# Patient Record
Sex: Male | Born: 1975 | Race: White | Hispanic: No | Marital: Married | Smoking: Current every day smoker
Health system: Southern US, Community
[De-identification: ages and names within clinical notes are randomized; demographics above are authoritative.]

## PROBLEM LIST (undated history)

## (undated) DIAGNOSIS — K219 Gastro-esophageal reflux disease without esophagitis: Secondary | ICD-10-CM

## (undated) DIAGNOSIS — Z8719 Personal history of other diseases of the digestive system: Secondary | ICD-10-CM

## (undated) DIAGNOSIS — F419 Anxiety disorder, unspecified: Secondary | ICD-10-CM

## (undated) DIAGNOSIS — E785 Hyperlipidemia, unspecified: Secondary | ICD-10-CM

## (undated) DIAGNOSIS — Z87442 Personal history of urinary calculi: Secondary | ICD-10-CM

## (undated) DIAGNOSIS — J189 Pneumonia, unspecified organism: Secondary | ICD-10-CM

## (undated) HISTORY — PX: SKIN CANCER EXCISION: SHX779

## (undated) HISTORY — PX: POLYPECTOMY: SHX149

---

## 2003-06-07 ENCOUNTER — Emergency Department (HOSPITAL_COMMUNITY): Admission: EM | Admit: 2003-06-07 | Discharge: 2003-06-07 | Payer: Self-pay | Admitting: Emergency Medicine

## 2004-03-19 ENCOUNTER — Emergency Department (HOSPITAL_COMMUNITY): Admission: EM | Admit: 2004-03-19 | Discharge: 2004-03-20 | Payer: Self-pay | Admitting: Emergency Medicine

## 2004-03-23 ENCOUNTER — Emergency Department (HOSPITAL_COMMUNITY): Admission: EM | Admit: 2004-03-23 | Discharge: 2004-03-23 | Payer: Self-pay | Admitting: *Deleted

## 2008-10-10 ENCOUNTER — Ambulatory Visit (HOSPITAL_COMMUNITY): Admission: RE | Admit: 2008-10-10 | Discharge: 2008-10-10 | Payer: Self-pay | Admitting: Family Medicine

## 2008-11-25 ENCOUNTER — Ambulatory Visit (HOSPITAL_COMMUNITY): Admission: RE | Admit: 2008-11-25 | Discharge: 2008-11-25 | Payer: Self-pay | Admitting: Family Medicine

## 2010-03-20 ENCOUNTER — Ambulatory Visit (HOSPITAL_COMMUNITY): Admission: RE | Admit: 2010-03-20 | Discharge: 2010-03-20 | Payer: Self-pay | Admitting: Family Medicine

## 2011-08-09 ENCOUNTER — Emergency Department (HOSPITAL_COMMUNITY)
Admission: EM | Admit: 2011-08-09 | Discharge: 2011-08-09 | Disposition: A | Discharge status: Home / Self Care | Payer: 59 | Attending: Emergency Medicine | Admitting: Emergency Medicine

## 2011-08-09 ENCOUNTER — Emergency Department (HOSPITAL_COMMUNITY): Payer: 59

## 2011-08-09 DIAGNOSIS — R1110 Vomiting, unspecified: Secondary | ICD-10-CM | POA: Insufficient documentation

## 2011-08-09 DIAGNOSIS — R059 Cough, unspecified: Secondary | ICD-10-CM | POA: Insufficient documentation

## 2011-08-09 DIAGNOSIS — R197 Diarrhea, unspecified: Secondary | ICD-10-CM | POA: Insufficient documentation

## 2011-08-09 DIAGNOSIS — R05 Cough: Secondary | ICD-10-CM | POA: Insufficient documentation

## 2011-08-09 DIAGNOSIS — Z79899 Other long term (current) drug therapy: Secondary | ICD-10-CM | POA: Insufficient documentation

## 2011-08-09 DIAGNOSIS — B9789 Other viral agents as the cause of diseases classified elsewhere: Secondary | ICD-10-CM

## 2011-08-09 DIAGNOSIS — F17200 Nicotine dependence, unspecified, uncomplicated: Secondary | ICD-10-CM | POA: Insufficient documentation

## 2011-08-09 LAB — BASIC METABOLIC PANEL
Chloride: 101 mEq/L (ref 96–112)
GFR calc Af Amer: >90 mL/min (ref >90)
GFR calc non Af Amer: >90 mL/min (ref >90)
Potassium: 3.9 mEq/L (ref 3.5–5.1)
Sodium: 138 mEq/L (ref 135–145)

## 2011-08-09 LAB — CBC
MCH: 31.4 pg (ref 26.0–34.0)
MCHC: 34.4 g/dL (ref 30.0–36.0)
Platelets: 173 10*3/uL (ref 150–400)

## 2011-08-09 MED ORDER — ONDANSETRON HCL 4 MG PO TABS: 8.0000 mg | ORAL_TABLET | Freq: Two times a day (BID) | ORAL | Status: AC | PRN

## 2011-08-09 MED ORDER — ONDANSETRON HCL 4 MG/2ML IJ SOLN
4.0000 mg | Freq: Once | INTRAMUSCULAR | Status: AC
  Administered 2011-08-09: 4 mg via INTRAVENOUS
  Filled 2011-08-09: qty 2

## 2011-08-09 MED ORDER — SODIUM CHLORIDE 0.9 % IV SOLN: 20.0000 mL | INTRAVENOUS | Status: DC

## 2011-08-09 MED ORDER — SODIUM CHLORIDE 0.9 % IV SOLN
20.0000 mL | INTRAVENOUS | Status: DC
  Administered 2011-08-09: 100 mL via INTRAVENOUS

## 2011-08-09 NOTE — ED Notes (Signed)
Vomiting, fever, cough, now diarrhea

## 2011-08-09 NOTE — ED Provider Notes (Signed)
History     CSN: [id number] Arrival date & time: 08/09/2011  5:35 AM  Chief Complaint  Patient presents with  . Emesis  . Fever    (Consider location/radiation/quality/duration/timing/severity/associated sxs/prior treatment) HPI Complains of cough with greenish sputum fever vomiting and diarrhea onset 4 PM yesterday maximum temperature 101 treated himself with NyQuil and Advil with partial relief no shortness of breath no other complaint no nausea at present treated with Zofran and IV fluids prior to my exam no abdominal pain no headache History reviewed. No pertinent past medical history. Past history hypercholesterolemia History reviewed. No pertinent past surgical history. Surgical history cyst removed from vocal cords History reviewed. No pertinent family history.  History  Substance Use Topics  . Smoking status: Current Everyday Smoker  . Smokeless tobacco: Not on file  . Alcohol Use: No   no alcohol no drugs    Review of Systems  Constitutional: Positive for fever.  HENT: Negative.   Respiratory: Positive for cough.   Cardiovascular: Negative.   Gastrointestinal: Positive for vomiting and diarrhea.  Musculoskeletal: Negative.   Skin: Negative.   Neurological: Negative.   Hematological: Negative.   Psychiatric/Behavioral: Negative.     Allergies  Sulfa antibiotics  Home Medications   Current Outpatient Rx  Name Route Sig Dispense Refill  . ALPRAZOLAM 0.5 MG PO TABS Oral Take 0.5 mg by mouth 2 (two) times daily as needed.      . ATORVASTATIN CALCIUM 10 MG PO TABS Oral Take 10 mg by mouth daily.      Marland Kitchen HYDROCODONE-ACETAMINOPHEN 10-650 MG PO TABS Oral Take 1 tablet by mouth 2 times daily at 12 noon and 4 pm.        BP 137/73  Pulse 111  Temp(Src) 98.9 F (37.2 C) (Oral)  Resp 20  Ht 5\' 7"  (1.702 m)  Wt 180 lb (81.647 kg)  BMI 28.19 kg/m2  SpO2 99%  Physical Exam  Nursing note and vitals reviewed. Constitutional: He appears well-developed and  well-nourished.  HENT:  Head: Normocephalic and atraumatic.  Eyes: Conjunctivae are normal. Pupils are equal, round, and reactive to light.  Neck: Neck supple. No tracheal deviation present. No thyromegaly present.  Cardiovascular: Normal rate and regular rhythm.   No murmur heard. Pulmonary/Chest: Effort normal.       Diffuse scant rhonchi no respiratory distress  Abdominal: Soft. Bowel sounds are normal. He exhibits no distension. There is no tenderness.  Musculoskeletal: Normal range of motion. He exhibits no edema and no tenderness.  Neurological: He is alert. Coordination normal.  Skin: Skin is warm and dry. No rash noted.  Psychiatric: He has a normal mood and affect.    ED Course  Procedures (including critical care time) at 8:20 a.m. patient feels much improved no nausea appears comfortable  Labs Reviewed  BASIC METABOLIC PANEL  CBC   No results found.   No diagnosis found.    MDM  Suspect viral illness no evidence of pneumonia on chest x-ray plan prescription for Zofran encourage  mouth fluids. Stop smoking encouraged return if feels worse for any reason. See Dr. Regino Schultze if not improved in a week Tylenol for fever or aches   Diagnosis #1 viral illness tightness #2 tobacco abuse     Doug Sou, MD 08/09/11 [phone]

## 2016-05-18 ENCOUNTER — Other Ambulatory Visit (HOSPITAL_COMMUNITY): Payer: Self-pay | Admitting: Internal Medicine

## 2016-05-18 ENCOUNTER — Ambulatory Visit (HOSPITAL_COMMUNITY)
Admission: RE | Admit: 2016-05-18 | Discharge: 2016-05-18 | Disposition: A | Discharge status: Home / Self Care | Payer: 59 | Source: Ambulatory Visit | Attending: Internal Medicine | Admitting: Internal Medicine

## 2016-05-18 DIAGNOSIS — M25511 Pain in right shoulder: Secondary | ICD-10-CM

## 2016-11-23 DIAGNOSIS — G894 Chronic pain syndrome: Secondary | ICD-10-CM | POA: Diagnosis not present

## 2016-12-08 DIAGNOSIS — Z0000 Encounter for general adult medical examination without abnormal findings: Secondary | ICD-10-CM | POA: Diagnosis not present

## 2016-12-08 DIAGNOSIS — E782 Mixed hyperlipidemia: Secondary | ICD-10-CM | POA: Diagnosis not present

## 2017-03-02 DIAGNOSIS — G894 Chronic pain syndrome: Secondary | ICD-10-CM | POA: Diagnosis not present

## 2017-03-02 DIAGNOSIS — Z1389 Encounter for screening for other disorder: Secondary | ICD-10-CM | POA: Diagnosis not present

## 2017-04-13 ENCOUNTER — Encounter (HOSPITAL_COMMUNITY): Payer: Self-pay | Admitting: *Deleted

## 2017-04-13 ENCOUNTER — Other Ambulatory Visit: Payer: Self-pay

## 2017-04-13 ENCOUNTER — Emergency Department (HOSPITAL_COMMUNITY): Payer: 59

## 2017-04-13 ENCOUNTER — Emergency Department (HOSPITAL_COMMUNITY)
Admission: EM | Admit: 2017-04-13 | Discharge: 2017-04-13 | Disposition: A | Discharge status: Home / Self Care | Payer: 59 | Attending: Emergency Medicine | Admitting: Emergency Medicine

## 2017-04-13 DIAGNOSIS — H539 Unspecified visual disturbance: Secondary | ICD-10-CM | POA: Insufficient documentation

## 2017-04-13 DIAGNOSIS — F17210 Nicotine dependence, cigarettes, uncomplicated: Secondary | ICD-10-CM | POA: Diagnosis not present

## 2017-04-13 DIAGNOSIS — S199XXA Unspecified injury of neck, initial encounter: Secondary | ICD-10-CM | POA: Diagnosis not present

## 2017-04-13 DIAGNOSIS — Z79899 Other long term (current) drug therapy: Secondary | ICD-10-CM | POA: Diagnosis not present

## 2017-04-13 DIAGNOSIS — R55 Syncope and collapse: Secondary | ICD-10-CM | POA: Insufficient documentation

## 2017-04-13 DIAGNOSIS — R51 Headache: Secondary | ICD-10-CM | POA: Diagnosis not present

## 2017-04-13 DIAGNOSIS — R05 Cough: Secondary | ICD-10-CM | POA: Diagnosis not present

## 2017-04-13 DIAGNOSIS — S0990XA Unspecified injury of head, initial encounter: Secondary | ICD-10-CM | POA: Diagnosis not present

## 2017-04-13 DIAGNOSIS — R42 Dizziness and giddiness: Secondary | ICD-10-CM | POA: Insufficient documentation

## 2017-04-13 DIAGNOSIS — R4182 Altered mental status, unspecified: Secondary | ICD-10-CM | POA: Diagnosis present

## 2017-04-13 HISTORY — DX: Hyperlipidemia, unspecified: E78.5

## 2017-04-13 LAB — CBC
HCT: 45.5 % (ref 39.0–52.0)
Hemoglobin: 15.9 g/dL (ref 13.0–17.0)
MCH: 30.9 pg (ref 26.0–34.0)
MCHC: 34.9 g/dL (ref 30.0–36.0)
MCV: 88.3 fL (ref 78.0–100.0)
PLATELETS: 225 10*3/uL (ref 150–400)
RBC: 5.15 MIL/uL (ref 4.22–5.81)
RDW: 12.8 % (ref 11.5–15.5)
WBC: 10.6 10*3/uL — AB (ref 4.0–10.5)

## 2017-04-13 LAB — HEPATIC FUNCTION PANEL
ALBUMIN: 4.5 g/dL (ref 3.5–5.0)
ALT: 39 U/L (ref 17–63)
AST: 30 U/L (ref 15–41)
Alkaline Phosphatase: 56 U/L (ref 38–126)
BILIRUBIN TOTAL: 0.7 mg/dL (ref 0.3–1.2)
Bilirubin, Direct: 0.1 mg/dL (ref 0.1–0.5)
Indirect Bilirubin: 0.6 mg/dL (ref 0.3–0.9)
Total Protein: 7.5 g/dL (ref 6.5–8.1)

## 2017-04-13 LAB — RAPID URINE DRUG SCREEN, HOSP PERFORMED
AMPHETAMINES: NOT DETECTED
Barbiturates: NOT DETECTED
Benzodiazepines: NOT DETECTED
Cocaine: NOT DETECTED
OPIATES: POSITIVE — AB
TETRAHYDROCANNABINOL: NOT DETECTED

## 2017-04-13 LAB — BASIC METABOLIC PANEL
Anion gap: 11 (ref 5–15)
BUN: 6 mg/dL (ref 6–20)
CALCIUM: 9.8 mg/dL (ref 8.9–10.3)
CHLORIDE: 101 mmol/L (ref 101–111)
CO2: 26 mmol/L (ref 22–32)
CREATININE: 0.93 mg/dL (ref 0.61–1.24)
GFR calc non Af Amer: >60 mL/min (ref >60)
Glucose, Bld: 98 mg/dL (ref 65–99)
Potassium: 3.9 mmol/L (ref 3.5–5.1)
SODIUM: 138 mmol/L (ref 135–145)

## 2017-04-13 LAB — URINALYSIS, ROUTINE W REFLEX MICROSCOPIC
Bilirubin Urine: NEGATIVE
Glucose, UA: NEGATIVE mg/dL
Hgb urine dipstick: NEGATIVE
Ketones, ur: NEGATIVE mg/dL
LEUKOCYTES UA: NEGATIVE
NITRITE: NEGATIVE
PROTEIN: NEGATIVE mg/dL
Specific Gravity, Urine: 1.006 (ref 1.005–1.030)
pH: 6 (ref 5.0–8.0)

## 2017-04-13 LAB — CBG MONITORING, ED: Glucose-Capillary: 105 mg/dL — ABNORMAL HIGH (ref 65–99)

## 2017-04-13 LAB — TROPONIN I

## 2017-04-13 MED ORDER — SODIUM CHLORIDE 0.9 % IV BOLUS (SEPSIS)
1000.0000 mL | Freq: Once | INTRAVENOUS | Status: AC
  Administered 2017-04-13: 1000 mL via INTRAVENOUS

## 2017-04-13 NOTE — ED Triage Notes (Signed)
Pt got up around 0300 this morning and went outside to pee. Pt states he felt like he was getting dizzy and then remembers waking up with his wife over top of him. Wife states she heard him fall and then found him on the ground outside. States he did hit his head on the cement steps. Pt continues to have head pain today. Pt is alert and oriented.

## 2017-04-13 NOTE — Discharge Instructions (Signed)
Follow-up with your doctor next week for recheck. Return if any more problems. Drink plenty of fluids

## 2017-04-13 NOTE — ED Notes (Signed)
CT states pt is next for CT.

## 2017-04-13 NOTE — ED Provider Notes (Signed)
[facility]PT Provider Note   CSN: [id number] Arrival date & time: 04/13/17  1626     History   Chief Complaint Chief Complaint  Patient presents with  . Loss of Consciousness    HPI Lee Perry is a 41 y.o. male.  Patient states that today at 3 morning he was sleeping on his couch and got up to go urinate outside. He passed out. His wife found him unconscious outside. She brought him back in on the couch. The patient awoke and refused to get seen at the emergency department. Patient states she's felt a little dizzy today otherwise okay. His wife stated that he complained earlier of some chest discomfort   The history is provided by the patient. No language interpreter was used.  Loss of Consciousness   This is a new problem. The current episode started less than 1 hour ago. The problem occurs rarely. The problem has been resolved. He lost consciousness for a period of 1 to 5 minutes. Associated with: Urinating. Associated symptoms include dizziness and visual change. Pertinent negatives include abdominal pain, back pain, chest pain, congestion, headaches and seizures. He has tried nothing for the symptoms.    Past Medical History:  Diagnosis Date  . Hyperlipemia     There are no active problems to display for this patient.   History reviewed. No pertinent surgical history.     Home Medications    Prior to Admission medications   Medication Sig Start Date End Date Taking? Authorizing Provider  acetaminophen (TYLENOL) 500 MG tablet Take 500 mg by mouth every 6 (six) hours as needed.   Yes [provider]  ALPRAZolam Prudy Feeler(XANAX) 0.5 MG tablet Take 0.5 mg by mouth 2 (two) times daily as needed.     Yes [provider]  esomeprazole (NEXIUM) 40 MG capsule Take 40 mg by mouth daily at 12 noon.   Yes [provider]  HYDROcodone-acetaminophen (LORCET) 10-650 MG per tablet Take 1 tablet by mouth 2 times daily at 12 noon and 4 pm.     Yes  [provider]    Family History No family history on file.  Social History Social History  Substance Use Topics  . Smoking status: Current Every Day Smoker    Packs/day: 2.00    Types: Cigarettes  . Smokeless tobacco: Never Used  . Alcohol use No     Allergies   Sulfa antibiotics   Review of Systems Review of Systems  Constitutional: Negative for appetite change and fatigue.  HENT: Negative for congestion, ear discharge and sinus pressure.   Eyes: Negative for discharge.  Respiratory: Negative for cough.   Cardiovascular: Positive for syncope. Negative for chest pain.  Gastrointestinal: Negative for abdominal pain and diarrhea.  Genitourinary: Negative for frequency and hematuria.  Musculoskeletal: Negative for back pain.  Skin: Negative for rash.  Neurological: Positive for dizziness. Negative for seizures and headaches.  Psychiatric/Behavioral: Negative for hallucinations.     Physical Exam Updated Vital Signs BP 139/90   Pulse 77   Temp 98 F (36.7 C) (Oral)   Resp 14   Ht 5\' 7"  (1.702 m)   Wt 81.6 kg (180 lb)   SpO2 98%   BMI 28.19 kg/m   Physical Exam  Constitutional: He is oriented to person, place, and time. He appears well-developed.  HENT:  Head: Normocephalic.  Eyes: Conjunctivae and EOM are normal. No scleral icterus.  Neck: Neck supple. No thyromegaly present.  Cardiovascular: Normal rate and regular rhythm.  Exam reveals no gallop and no friction rub.   No murmur heard. Pulmonary/Chest: No stridor. He has no wheezes. He has no rales. He exhibits no tenderness.  Abdominal: He exhibits no distension. There is no tenderness. There is no rebound.  Musculoskeletal: Normal range of motion. He exhibits no edema.  Lymphadenopathy:    He has no cervical adenopathy.  Neurological: He is oriented to person, place, and time. He exhibits normal muscle tone. Coordination normal.  Skin: No rash noted. No erythema.  Psychiatric: He has a normal  mood and affect. His behavior is normal.     ED Treatments / Results  Labs (all labs ordered are listed, but only abnormal results are displayed) Labs Reviewed  CBC - Abnormal; Notable for the following:       Result Value   WBC 10.6 (*)    All other components within normal limits  URINALYSIS, ROUTINE W REFLEX MICROSCOPIC - Abnormal; Notable for the following:    Color, Urine STRAW (*)    All other components within normal limits  RAPID URINE DRUG SCREEN, HOSP PERFORMED - Abnormal; Notable for the following:    Opiates POSITIVE (*)    All other components within normal limits  CBG MONITORING, ED - Abnormal; Notable for the following:    Glucose-Capillary 105 (*)    All other components within normal limits  BASIC METABOLIC PANEL  TROPONIN I  HEPATIC FUNCTION PANEL  CBG MONITORING, ED    EKG  EKG Interpretation None       Radiology Dg Chest 2 View  Result Date: 04/13/2017 CLINICAL DATA:  Chronic cough, weakness EXAM: CHEST  2 VIEW COMPARISON:  08/09/2011 FINDINGS: Lungs are clear.  No pleural effusion or pneumothorax. The heart is normal size. Visualized osseous structures are within normal limits. IMPRESSION: Normal chest radiographs. Electronically Signed   By: Charline Bills M.D.   On: 04/13/2017 19:34   Ct Head Wo Contrast  Result Date: 04/13/2017 CLINICAL DATA:  41 year old male with syncope yesterday, fall and head and neck injury. Dizziness, headache and neck pain today. Initial encounter. EXAM: CT HEAD WITHOUT CONTRAST CT CERVICAL SPINE WITHOUT CONTRAST TECHNIQUE: Multidetector CT imaging of the head and cervical spine was performed following the standard protocol without intravenous contrast. Multiplanar CT image reconstructions of the cervical spine were also generated. COMPARISON:  10/10/2008 cervical spine radiographs FINDINGS: CT HEAD FINDINGS Brain: No evidence of acute infarction, hemorrhage, hydrocephalus, extra-axial collection or mass lesion/mass effect.  Mild generalized volume loss noted. Vascular: No hyperdense vessel or unexpected calcification. Skull: Normal. Negative for fracture or focal lesion. Sinuses/Orbits: No acute finding. Other: None. CT CERVICAL SPINE FINDINGS Alignment: Normal. Skull base and vertebrae: No acute fracture. No primary bone lesion or focal pathologic process. Soft tissues and spinal canal: No prevertebral fluid or swelling. No visible canal hematoma. Disc levels: Mild degenerative disc disease, spondylosis and mild broad-based disc bulges at C4-5, C5-6 and C6-7 noted. Upper chest: Negative. Other: None IMPRESSION: No evidence of acute intracranial abnormality. No static evidence of acute injury to the cervical spine. Mild degenerative changes and broad-based disc bulges at C4-5, C5-6 and C6-7. Electronically Signed   By: Harmon Pier M.D.   On: 04/13/2017 19:16   Ct Cervical Spine Wo Contrast  Result Date: 04/13/2017 CLINICAL DATA:  41 year old male with syncope yesterday, fall and head and neck injury. Dizziness, headache and neck pain today. Initial encounter. EXAM: CT HEAD WITHOUT CONTRAST CT CERVICAL SPINE WITHOUT CONTRAST TECHNIQUE: Multidetector CT imaging of the head  and cervical spine was performed following the standard protocol without intravenous contrast. Multiplanar CT image reconstructions of the cervical spine were also generated. COMPARISON:  10/10/2008 cervical spine radiographs FINDINGS: CT HEAD FINDINGS Brain: No evidence of acute infarction, hemorrhage, hydrocephalus, extra-axial collection or mass lesion/mass effect. Mild generalized volume loss noted. Vascular: No hyperdense vessel or unexpected calcification. Skull: Normal. Negative for fracture or focal lesion. Sinuses/Orbits: No acute finding. Other: None. CT CERVICAL SPINE FINDINGS Alignment: Normal. Skull base and vertebrae: No acute fracture. No primary bone lesion or focal pathologic process. Soft tissues and spinal canal: No prevertebral fluid or  swelling. No visible canal hematoma. Disc levels: Mild degenerative disc disease, spondylosis and mild broad-based disc bulges at C4-5, C5-6 and C6-7 noted. Upper chest: Negative. Other: None IMPRESSION: No evidence of acute intracranial abnormality. No static evidence of acute injury to the cervical spine. Mild degenerative changes and broad-based disc bulges at C4-5, C5-6 and C6-7. Electronically Signed   By: Harmon Pier M.D.   On: 04/13/2017 19:16    Procedures Procedures (including critical care time)  Medications Ordered in ED Medications  sodium chloride 0.9 % bolus 1,000 mL (0 mLs Intravenous Stopped 04/13/17 2001)     Initial Impression / Assessment and Plan / ED Course  I have reviewed the triage vital signs and the nursing notes.  Pertinent labs & imaging results that were available during my care of the patient were reviewed by me and considered in my medical decision making (see chart for details).   labs unremarkable,  Ct neg.  Pt will follow up with pcp.  Dx micturition syncope    Final Clinical Impressions(s) / ED Diagnoses   Final diagnoses:  Syncope and collapse    New Prescriptions New Prescriptions   No medications on file     Bethann Berkshire, MD 04/13/17 2048

## 2017-04-18 DIAGNOSIS — R55 Syncope and collapse: Secondary | ICD-10-CM | POA: Diagnosis not present

## 2017-04-18 DIAGNOSIS — G8929 Other chronic pain: Secondary | ICD-10-CM | POA: Diagnosis not present

## 2017-06-20 DIAGNOSIS — G894 Chronic pain syndrome: Secondary | ICD-10-CM | POA: Diagnosis not present

## 2017-06-20 DIAGNOSIS — R51 Headache: Secondary | ICD-10-CM | POA: Diagnosis not present

## 2017-07-01 ENCOUNTER — Other Ambulatory Visit (HOSPITAL_COMMUNITY): Payer: Self-pay | Admitting: Internal Medicine

## 2017-07-01 DIAGNOSIS — R42 Dizziness and giddiness: Secondary | ICD-10-CM

## 2017-07-07 ENCOUNTER — Other Ambulatory Visit (HOSPITAL_COMMUNITY): Payer: Self-pay | Admitting: Internal Medicine

## 2017-07-07 DIAGNOSIS — R42 Dizziness and giddiness: Secondary | ICD-10-CM

## 2017-07-25 ENCOUNTER — Other Ambulatory Visit (HOSPITAL_COMMUNITY): Payer: Self-pay | Admitting: Internal Medicine

## 2017-07-25 DIAGNOSIS — Z1389 Encounter for screening for other disorder: Secondary | ICD-10-CM

## 2017-07-25 DIAGNOSIS — Z0189 Encounter for other specified special examinations: Secondary | ICD-10-CM

## 2017-07-28 ENCOUNTER — Encounter (HOSPITAL_COMMUNITY): Payer: Self-pay

## 2017-07-28 ENCOUNTER — Ambulatory Visit (HOSPITAL_COMMUNITY): Payer: 59

## 2017-07-28 ENCOUNTER — Ambulatory Visit (HOSPITAL_COMMUNITY): Admission: RE | Admit: 2017-07-28 | Payer: 59 | Source: Ambulatory Visit

## 2017-08-03 ENCOUNTER — Other Ambulatory Visit (HOSPITAL_COMMUNITY): Payer: 59

## 2017-08-03 ENCOUNTER — Ambulatory Visit (HOSPITAL_COMMUNITY): Payer: 59

## 2017-08-04 ENCOUNTER — Ambulatory Visit (HOSPITAL_COMMUNITY)
Admission: RE | Admit: 2017-08-04 | Discharge: 2017-08-04 | Disposition: A | Discharge status: Home / Self Care | Payer: 59 | Source: Ambulatory Visit | Attending: Internal Medicine | Admitting: Internal Medicine

## 2017-08-04 DIAGNOSIS — Z1389 Encounter for screening for other disorder: Secondary | ICD-10-CM

## 2017-08-04 DIAGNOSIS — R42 Dizziness and giddiness: Secondary | ICD-10-CM

## 2017-08-04 DIAGNOSIS — Z01818 Encounter for other preprocedural examination: Secondary | ICD-10-CM | POA: Diagnosis not present

## 2017-08-04 MED ORDER — GADOBENATE DIMEGLUMINE 529 MG/ML IV SOLN
17.0000 mL | Freq: Once | INTRAVENOUS | Status: AC | PRN
  Administered 2017-08-04: 20 mL via INTRAVENOUS

## 2017-09-16 DIAGNOSIS — G894 Chronic pain syndrome: Secondary | ICD-10-CM | POA: Diagnosis not present

## 2017-12-26 DIAGNOSIS — I10 Essential (primary) hypertension: Secondary | ICD-10-CM | POA: Diagnosis not present

## 2018-03-28 DIAGNOSIS — K219 Gastro-esophageal reflux disease without esophagitis: Secondary | ICD-10-CM | POA: Diagnosis not present

## 2018-03-28 DIAGNOSIS — E663 Overweight: Secondary | ICD-10-CM | POA: Diagnosis not present

## 2018-03-28 DIAGNOSIS — G894 Chronic pain syndrome: Secondary | ICD-10-CM | POA: Diagnosis not present

## 2018-06-12 DIAGNOSIS — Z0001 Encounter for general adult medical examination with abnormal findings: Secondary | ICD-10-CM | POA: Diagnosis not present

## 2018-06-12 DIAGNOSIS — E6609 Other obesity due to excess calories: Secondary | ICD-10-CM | POA: Diagnosis not present

## 2018-06-12 DIAGNOSIS — Z1389 Encounter for screening for other disorder: Secondary | ICD-10-CM | POA: Diagnosis not present

## 2018-06-12 DIAGNOSIS — I10 Essential (primary) hypertension: Secondary | ICD-10-CM | POA: Diagnosis not present

## 2018-08-23 DIAGNOSIS — G894 Chronic pain syndrome: Secondary | ICD-10-CM | POA: Diagnosis not present

## 2018-08-23 DIAGNOSIS — Z6830 Body mass index (BMI) 30.0-30.9, adult: Secondary | ICD-10-CM | POA: Diagnosis not present

## 2018-08-23 DIAGNOSIS — K219 Gastro-esophageal reflux disease without esophagitis: Secondary | ICD-10-CM | POA: Diagnosis not present

## 2018-11-06 DIAGNOSIS — J329 Chronic sinusitis, unspecified: Secondary | ICD-10-CM | POA: Diagnosis not present

## 2018-11-06 DIAGNOSIS — I10 Essential (primary) hypertension: Secondary | ICD-10-CM | POA: Diagnosis not present

## 2018-11-06 DIAGNOSIS — L738 Other specified follicular disorders: Secondary | ICD-10-CM | POA: Diagnosis not present

## 2018-12-08 DIAGNOSIS — G894 Chronic pain syndrome: Secondary | ICD-10-CM | POA: Diagnosis not present

## 2018-12-08 DIAGNOSIS — T50905A Adverse effect of unspecified drugs, medicaments and biological substances, initial encounter: Secondary | ICD-10-CM | POA: Diagnosis not present

## 2018-12-08 DIAGNOSIS — M353 Polymyalgia rheumatica: Secondary | ICD-10-CM | POA: Diagnosis not present

## 2019-01-23 DIAGNOSIS — G894 Chronic pain syndrome: Secondary | ICD-10-CM | POA: Diagnosis not present

## 2019-03-12 DIAGNOSIS — G894 Chronic pain syndrome: Secondary | ICD-10-CM | POA: Diagnosis not present

## 2019-03-12 DIAGNOSIS — G4700 Insomnia, unspecified: Secondary | ICD-10-CM | POA: Diagnosis not present

## 2020-01-02 DIAGNOSIS — M7712 Lateral epicondylitis, left elbow: Secondary | ICD-10-CM | POA: Diagnosis not present

## 2020-01-02 DIAGNOSIS — G894 Chronic pain syndrome: Secondary | ICD-10-CM | POA: Diagnosis not present

## 2020-02-06 DIAGNOSIS — N529 Male erectile dysfunction, unspecified: Secondary | ICD-10-CM | POA: Diagnosis not present

## 2020-02-06 DIAGNOSIS — G894 Chronic pain syndrome: Secondary | ICD-10-CM | POA: Diagnosis not present

## 2020-02-06 DIAGNOSIS — G4709 Other insomnia: Secondary | ICD-10-CM | POA: Diagnosis not present

## 2020-03-11 DIAGNOSIS — M7712 Lateral epicondylitis, left elbow: Secondary | ICD-10-CM | POA: Diagnosis not present

## 2020-03-11 DIAGNOSIS — G894 Chronic pain syndrome: Secondary | ICD-10-CM | POA: Diagnosis not present

## 2020-03-11 DIAGNOSIS — K219 Gastro-esophageal reflux disease without esophagitis: Secondary | ICD-10-CM | POA: Diagnosis not present

## 2020-03-11 DIAGNOSIS — I10 Essential (primary) hypertension: Secondary | ICD-10-CM | POA: Diagnosis not present

## 2020-05-15 DIAGNOSIS — K219 Gastro-esophageal reflux disease without esophagitis: Secondary | ICD-10-CM | POA: Diagnosis not present

## 2020-05-15 DIAGNOSIS — I10 Essential (primary) hypertension: Secondary | ICD-10-CM | POA: Diagnosis not present

## 2020-05-15 DIAGNOSIS — G894 Chronic pain syndrome: Secondary | ICD-10-CM | POA: Diagnosis not present

## 2020-07-31 DIAGNOSIS — I10 Essential (primary) hypertension: Secondary | ICD-10-CM | POA: Diagnosis not present

## 2020-07-31 DIAGNOSIS — G894 Chronic pain syndrome: Secondary | ICD-10-CM | POA: Diagnosis not present

## 2020-07-31 DIAGNOSIS — K219 Gastro-esophageal reflux disease without esophagitis: Secondary | ICD-10-CM | POA: Diagnosis not present

## 2020-07-31 DIAGNOSIS — Z1331 Encounter for screening for depression: Secondary | ICD-10-CM | POA: Diagnosis not present

## 2020-07-31 DIAGNOSIS — Z6829 Body mass index (BMI) 29.0-29.9, adult: Secondary | ICD-10-CM | POA: Diagnosis not present

## 2020-07-31 DIAGNOSIS — G4700 Insomnia, unspecified: Secondary | ICD-10-CM | POA: Diagnosis not present

## 2020-08-26 DIAGNOSIS — E7849 Other hyperlipidemia: Secondary | ICD-10-CM | POA: Diagnosis not present

## 2020-08-26 DIAGNOSIS — G894 Chronic pain syndrome: Secondary | ICD-10-CM | POA: Diagnosis not present

## 2020-08-26 DIAGNOSIS — Z0000 Encounter for general adult medical examination without abnormal findings: Secondary | ICD-10-CM | POA: Diagnosis not present

## 2020-08-26 DIAGNOSIS — E663 Overweight: Secondary | ICD-10-CM | POA: Diagnosis not present

## 2020-08-26 DIAGNOSIS — Z6829 Body mass index (BMI) 29.0-29.9, adult: Secondary | ICD-10-CM | POA: Diagnosis not present

## 2020-08-26 DIAGNOSIS — K219 Gastro-esophageal reflux disease without esophagitis: Secondary | ICD-10-CM | POA: Diagnosis not present

## 2020-08-26 DIAGNOSIS — Z125 Encounter for screening for malignant neoplasm of prostate: Secondary | ICD-10-CM | POA: Diagnosis not present

## 2020-08-26 DIAGNOSIS — Z79891 Long term (current) use of opiate analgesic: Secondary | ICD-10-CM | POA: Diagnosis not present

## 2020-10-01 DIAGNOSIS — I10 Essential (primary) hypertension: Secondary | ICD-10-CM | POA: Diagnosis not present

## 2020-10-01 DIAGNOSIS — G894 Chronic pain syndrome: Secondary | ICD-10-CM | POA: Diagnosis not present

## 2020-11-11 DIAGNOSIS — G894 Chronic pain syndrome: Secondary | ICD-10-CM | POA: Diagnosis not present

## 2020-12-11 DIAGNOSIS — G894 Chronic pain syndrome: Secondary | ICD-10-CM | POA: Diagnosis not present

## 2020-12-11 DIAGNOSIS — M7711 Lateral epicondylitis, right elbow: Secondary | ICD-10-CM | POA: Diagnosis not present

## 2020-12-11 DIAGNOSIS — Z6830 Body mass index (BMI) 30.0-30.9, adult: Secondary | ICD-10-CM | POA: Diagnosis not present

## 2020-12-11 DIAGNOSIS — E6609 Other obesity due to excess calories: Secondary | ICD-10-CM | POA: Diagnosis not present

## 2020-12-11 DIAGNOSIS — I10 Essential (primary) hypertension: Secondary | ICD-10-CM | POA: Diagnosis not present

## 2020-12-11 DIAGNOSIS — Z1389 Encounter for screening for other disorder: Secondary | ICD-10-CM | POA: Diagnosis not present

## 2021-01-14 DIAGNOSIS — M7711 Lateral epicondylitis, right elbow: Secondary | ICD-10-CM | POA: Diagnosis not present

## 2021-01-14 DIAGNOSIS — G894 Chronic pain syndrome: Secondary | ICD-10-CM | POA: Diagnosis not present

## 2021-01-14 DIAGNOSIS — I10 Essential (primary) hypertension: Secondary | ICD-10-CM | POA: Diagnosis not present

## 2021-02-12 DIAGNOSIS — G894 Chronic pain syndrome: Secondary | ICD-10-CM | POA: Diagnosis not present

## 2021-03-19 DIAGNOSIS — Z125 Encounter for screening for malignant neoplasm of prostate: Secondary | ICD-10-CM | POA: Diagnosis not present

## 2021-03-19 DIAGNOSIS — I10 Essential (primary) hypertension: Secondary | ICD-10-CM | POA: Diagnosis not present

## 2021-03-19 DIAGNOSIS — F419 Anxiety disorder, unspecified: Secondary | ICD-10-CM | POA: Diagnosis not present

## 2021-03-19 DIAGNOSIS — G894 Chronic pain syndrome: Secondary | ICD-10-CM | POA: Diagnosis not present

## 2021-03-19 DIAGNOSIS — K219 Gastro-esophageal reflux disease without esophagitis: Secondary | ICD-10-CM | POA: Diagnosis not present

## 2021-03-19 DIAGNOSIS — N529 Male erectile dysfunction, unspecified: Secondary | ICD-10-CM | POA: Diagnosis not present

## 2021-03-19 DIAGNOSIS — Z6830 Body mass index (BMI) 30.0-30.9, adult: Secondary | ICD-10-CM | POA: Diagnosis not present

## 2021-03-19 DIAGNOSIS — Z0000 Encounter for general adult medical examination without abnormal findings: Secondary | ICD-10-CM | POA: Diagnosis not present

## 2021-03-19 DIAGNOSIS — E6609 Other obesity due to excess calories: Secondary | ICD-10-CM | POA: Diagnosis not present

## 2021-03-19 DIAGNOSIS — R7309 Other abnormal glucose: Secondary | ICD-10-CM | POA: Diagnosis not present

## 2021-03-19 DIAGNOSIS — E7849 Other hyperlipidemia: Secondary | ICD-10-CM | POA: Diagnosis not present

## 2021-04-21 DIAGNOSIS — U071 COVID-19: Secondary | ICD-10-CM | POA: Diagnosis not present

## 2021-04-21 DIAGNOSIS — G894 Chronic pain syndrome: Secondary | ICD-10-CM | POA: Diagnosis not present

## 2021-05-20 DIAGNOSIS — M47816 Spondylosis without myelopathy or radiculopathy, lumbar region: Secondary | ICD-10-CM | POA: Diagnosis not present

## 2021-05-20 DIAGNOSIS — I10 Essential (primary) hypertension: Secondary | ICD-10-CM | POA: Diagnosis not present

## 2021-05-20 DIAGNOSIS — G894 Chronic pain syndrome: Secondary | ICD-10-CM | POA: Diagnosis not present

## 2021-07-23 DIAGNOSIS — L720 Epidermal cyst: Secondary | ICD-10-CM | POA: Diagnosis not present

## 2021-07-23 DIAGNOSIS — E782 Mixed hyperlipidemia: Secondary | ICD-10-CM | POA: Diagnosis not present

## 2021-07-23 DIAGNOSIS — F419 Anxiety disorder, unspecified: Secondary | ICD-10-CM | POA: Diagnosis not present

## 2021-07-23 DIAGNOSIS — I10 Essential (primary) hypertension: Secondary | ICD-10-CM | POA: Diagnosis not present

## 2021-07-23 DIAGNOSIS — L989 Disorder of the skin and subcutaneous tissue, unspecified: Secondary | ICD-10-CM | POA: Diagnosis not present

## 2021-07-23 DIAGNOSIS — G894 Chronic pain syndrome: Secondary | ICD-10-CM | POA: Diagnosis not present

## 2021-07-23 DIAGNOSIS — D489 Neoplasm of uncertain behavior, unspecified: Secondary | ICD-10-CM | POA: Diagnosis not present

## 2021-09-02 DIAGNOSIS — I10 Essential (primary) hypertension: Secondary | ICD-10-CM | POA: Diagnosis not present

## 2021-09-02 DIAGNOSIS — G894 Chronic pain syndrome: Secondary | ICD-10-CM | POA: Diagnosis not present

## 2021-10-13 DIAGNOSIS — I10 Essential (primary) hypertension: Secondary | ICD-10-CM | POA: Diagnosis not present

## 2021-10-13 DIAGNOSIS — G894 Chronic pain syndrome: Secondary | ICD-10-CM | POA: Diagnosis not present

## 2021-12-17 DIAGNOSIS — E6609 Other obesity due to excess calories: Secondary | ICD-10-CM | POA: Diagnosis not present

## 2021-12-17 DIAGNOSIS — G894 Chronic pain syndrome: Secondary | ICD-10-CM | POA: Diagnosis not present

## 2021-12-17 DIAGNOSIS — Z6831 Body mass index (BMI) 31.0-31.9, adult: Secondary | ICD-10-CM | POA: Diagnosis not present

## 2021-12-17 DIAGNOSIS — I10 Essential (primary) hypertension: Secondary | ICD-10-CM | POA: Diagnosis not present

## 2021-12-17 DIAGNOSIS — G4700 Insomnia, unspecified: Secondary | ICD-10-CM | POA: Diagnosis not present

## 2022-01-04 ENCOUNTER — Other Ambulatory Visit: Payer: Self-pay

## 2022-01-04 ENCOUNTER — Emergency Department (HOSPITAL_COMMUNITY): Payer: BC Managed Care – PPO

## 2022-01-04 ENCOUNTER — Emergency Department (HOSPITAL_COMMUNITY)
Admission: EM | Admit: 2022-01-04 | Discharge: 2022-01-05 | Disposition: A | Discharge status: Home / Self Care | Payer: BC Managed Care – PPO | Attending: Emergency Medicine | Admitting: Emergency Medicine

## 2022-01-04 ENCOUNTER — Encounter (HOSPITAL_COMMUNITY): Payer: Self-pay

## 2022-01-04 DIAGNOSIS — S6710XA Crushing injury of unspecified finger(s), initial encounter: Secondary | ICD-10-CM

## 2022-01-04 DIAGNOSIS — W231XXA Caught, crushed, jammed, or pinched between stationary objects, initial encounter: Secondary | ICD-10-CM | POA: Insufficient documentation

## 2022-01-04 DIAGNOSIS — S61213A Laceration without foreign body of left middle finger without damage to nail, initial encounter: Secondary | ICD-10-CM | POA: Diagnosis not present

## 2022-01-04 DIAGNOSIS — S67193A Crushing injury of left middle finger, initial encounter: Secondary | ICD-10-CM | POA: Insufficient documentation

## 2022-01-04 DIAGNOSIS — M7989 Other specified soft tissue disorders: Secondary | ICD-10-CM | POA: Diagnosis not present

## 2022-01-04 DIAGNOSIS — S6992XA Unspecified injury of left wrist, hand and finger(s), initial encounter: Secondary | ICD-10-CM | POA: Diagnosis not present

## 2022-01-04 HISTORY — DX: Anxiety disorder, unspecified: F41.9

## 2022-01-04 NOTE — ED Triage Notes (Addendum)
Pov from home. Cc of left middle finger laceration said that his finger was smashed between a panel and also caused a laceration. Michela Pitcher it was heavily bleeding. Happened a little before noon today. States he was too anxious to come.  ?Denies any shots or needles. Would like anxiety medication. Supposed to take xanax.  ?

## 2022-01-05 MED ORDER — HYDROGEN PEROXIDE 3 % EX SOLN
CUTANEOUS | Status: AC
  Filled 2022-01-05: qty 473

## 2022-01-05 NOTE — ED Notes (Signed)
Patient is soaking finger in peroxide and normal saline.  ?

## 2022-01-05 NOTE — ED Notes (Signed)
Non adherent dressing applied to the wound. Finger was wrapped with guaze and finger splint applied. Patient was given education on wound care.  ?

## 2022-01-05 NOTE — ED Provider Notes (Signed)
?[facility] ?Provider Note ? ? ?CSN: [id number] ?Arrival date & time: 01/04/22  2235 ? ?  ? ?History ? ?Chief Complaint  ?Patient presents with  ? Laceration  ? ? ?Lee Perry is a 46 y.o. male. ? ?Patient presents to the emergency department for evaluation of injury to left middle finger.  Patient reports that he smashed his finger between 2 pieces of metal earlier today.  Complains of pain, no numbness, tingling. ? ? ?  ? ?Home Medications ?Prior to Admission medications   ?Medication Sig Start Date End Date Taking? Authorizing Provider  ?acetaminophen (TYLENOL) 500 MG tablet Take 500 mg by mouth every 6 (six) hours as needed.    [provider]  ?ALPRAZolam Prudy Feeler) 0.5 MG tablet Take 0.5 mg by mouth 2 (two) times daily as needed.      [provider]  ?esomeprazole (NEXIUM) 40 MG capsule Take 40 mg by mouth daily at 12 noon.    [provider]  ?HYDROcodone-acetaminophen (LORCET) 10-650 MG per tablet Take 1 tablet by mouth 2 times daily at 12 noon and 4 pm.      [provider]  ?   ? ?Allergies    ?Sulfa antibiotics   ? ?Review of Systems   ?Review of Systems  ?Skin:  Positive for wound.  ? ?Physical Exam ?Updated Vital Signs ?BP (!) 141/100   Pulse 81   Temp 98.6 ?F (37 ?C) (Oral)   Resp 20   Ht 5\' 7"  (1.702 m)   Wt 82 kg   SpO2 100%   BMI 28.31 kg/m?  ?Physical Exam ?Vitals and nursing note reviewed.  ?Constitutional:   ?   Appearance: Normal appearance.  ?Musculoskeletal:  ?   Left hand: Swelling (Distal left middle finger), laceration (Distal left middle finger) and tenderness (Left middle finger) present. There is no disruption of two-point discrimination. Normal capillary refill.  ?Skin: ?   Findings: Signs of injury and laceration (Pad of left middle finger) present.  ?Neurological:  ?   General: No focal deficit present.  ?   Mental Status: He is alert.  ?   Sensory: Sensation is intact.  ?   Motor: Motor function is intact.  ? ? ?ED  Results / Procedures / Treatments   ?Labs ?(all labs ordered are listed, but only abnormal results are displayed) ?Labs Reviewed - No data to display ? ?EKG ?None ? ?Radiology ?DG Finger Middle Left ? ?Result Date: 01/04/2022 ?CLINICAL DATA:  Injury. EXAM: LEFT MIDDLE FINGER 2+V COMPARISON:  None. FINDINGS: There is no evidence of fracture or dislocation. There is no evidence of arthropathy or other focal bone abnormality. There is soft tissue swelling of the distal finger. There is no radiopaque foreign body. IMPRESSION: 1. No acute fracture or dislocation.  No evidence for foreign body. Electronically Signed   By: 03/06/2022 M.D.   On: 01/04/2022 23:32   ? ?Procedures ?Procedures  ? ? ?Medications Ordered in ED ?Medications  ?hydrogen peroxide 3 % external solution (  Given 01/05/22 0048)  ? ? ?ED Course/ Medical Decision Making/ A&P ?  ?                        ?Medical Decision Making ?Amount and/or Complexity of Data Reviewed ?Radiology: ordered. ? ? ?Patient presents to the emergency department for evaluation of crush injury to left middle finger.  There is a laceration and fairly significant swelling of the distal left  middle finger.  X-ray does not show any fracture.  Wound would benefit from sutures, but patient reports that he is terrified of needles and does not want stitches.  Based on this, wound was cleaned out, bulky dressing applied and finger splint.  Given return precautions for signs of infection. ? ? ? ? ? ? ? ?Final Clinical Impression(s) / ED Diagnoses ?Final diagnoses:  ?Laceration of left middle finger without foreign body without damage to nail, initial encounter  ?Crushing injury of finger, initial encounter  ? ? ?Rx / DC Orders ?ED Discharge Orders   ? ? None  ? ?  ? ? ?  ?Gilda Crease, MD ?01/05/22 0050 ? ?

## 2022-01-07 DIAGNOSIS — D3617 Benign neoplasm of peripheral nerves and autonomic nervous system of trunk, unspecified: Secondary | ICD-10-CM | POA: Diagnosis not present

## 2022-01-07 DIAGNOSIS — L821 Other seborrheic keratosis: Secondary | ICD-10-CM | POA: Diagnosis not present

## 2022-01-07 DIAGNOSIS — D2261 Melanocytic nevi of right upper limb, including shoulder: Secondary | ICD-10-CM | POA: Diagnosis not present

## 2022-01-07 DIAGNOSIS — D225 Melanocytic nevi of trunk: Secondary | ICD-10-CM | POA: Diagnosis not present

## 2022-02-15 DIAGNOSIS — C4441 Basal cell carcinoma of skin of scalp and neck: Secondary | ICD-10-CM | POA: Diagnosis not present

## 2022-02-15 DIAGNOSIS — C44519 Basal cell carcinoma of skin of other part of trunk: Secondary | ICD-10-CM | POA: Diagnosis not present

## 2022-02-18 DIAGNOSIS — I10 Essential (primary) hypertension: Secondary | ICD-10-CM | POA: Diagnosis not present

## 2022-02-18 DIAGNOSIS — G894 Chronic pain syndrome: Secondary | ICD-10-CM | POA: Diagnosis not present

## 2022-03-23 ENCOUNTER — Encounter (HOSPITAL_COMMUNITY): Payer: Self-pay | Admitting: Emergency Medicine

## 2022-03-23 ENCOUNTER — Other Ambulatory Visit: Payer: Self-pay

## 2022-03-23 ENCOUNTER — Emergency Department (HOSPITAL_COMMUNITY): Payer: BC Managed Care – PPO

## 2022-03-23 ENCOUNTER — Emergency Department (HOSPITAL_COMMUNITY)
Admission: EM | Admit: 2022-03-23 | Discharge: 2022-03-23 | Disposition: A | Discharge status: Home / Self Care | Payer: BC Managed Care – PPO | Attending: Emergency Medicine | Admitting: Emergency Medicine

## 2022-03-23 DIAGNOSIS — S30860A Insect bite (nonvenomous) of lower back and pelvis, initial encounter: Secondary | ICD-10-CM | POA: Diagnosis not present

## 2022-03-23 DIAGNOSIS — R0789 Other chest pain: Secondary | ICD-10-CM | POA: Diagnosis not present

## 2022-03-23 DIAGNOSIS — R079 Chest pain, unspecified: Secondary | ICD-10-CM | POA: Diagnosis not present

## 2022-03-23 DIAGNOSIS — W57XXXA Bitten or stung by nonvenomous insect and other nonvenomous arthropods, initial encounter: Secondary | ICD-10-CM

## 2022-03-23 DIAGNOSIS — F419 Anxiety disorder, unspecified: Secondary | ICD-10-CM

## 2022-03-23 DIAGNOSIS — S3992XA Unspecified injury of lower back, initial encounter: Secondary | ICD-10-CM | POA: Diagnosis not present

## 2022-03-23 DIAGNOSIS — Z6830 Body mass index (BMI) 30.0-30.9, adult: Secondary | ICD-10-CM | POA: Diagnosis not present

## 2022-03-23 DIAGNOSIS — E6609 Other obesity due to excess calories: Secondary | ICD-10-CM | POA: Diagnosis not present

## 2022-03-23 LAB — BASIC METABOLIC PANEL
Anion gap: 7 (ref 5–15)
BUN: 10 mg/dL (ref 6–20)
CO2: 25 mmol/L (ref 22–32)
Calcium: 9.6 mg/dL (ref 8.9–10.3)
Chloride: 106 mmol/L (ref 98–111)
Creatinine, Ser: 1.09 mg/dL (ref 0.61–1.24)
GFR, Estimated: >60 mL/min (ref >60)
Glucose, Bld: 102 mg/dL — ABNORMAL HIGH (ref 70–99)
Potassium: 4.3 mmol/L (ref 3.5–5.1)
Sodium: 138 mmol/L (ref 135–145)

## 2022-03-23 LAB — CBC
HCT: 46.3 % (ref 39.0–52.0)
Hemoglobin: 15.9 g/dL (ref 13.0–17.0)
MCH: 30.2 pg (ref 26.0–34.0)
MCHC: 34.3 g/dL (ref 30.0–36.0)
MCV: 87.9 fL (ref 80.0–100.0)
Platelets: 290 10*3/uL (ref 150–400)
RBC: 5.27 MIL/uL (ref 4.22–5.81)
RDW: 12.8 % (ref 11.5–15.5)
WBC: 11.5 10*3/uL — ABNORMAL HIGH (ref 4.0–10.5)
nRBC: 0 % (ref 0.0–0.2)

## 2022-03-23 LAB — TROPONIN I (HIGH SENSITIVITY)
Troponin I (High Sensitivity): 4 ng/L (ref <18)
Troponin I (High Sensitivity): 4 ng/L (ref <18)

## 2022-03-23 MED ORDER — OXYCODONE-ACETAMINOPHEN 5-325 MG PO TABS: 1.0000 | ORAL_TABLET | Freq: Four times a day (QID) | ORAL | 0 refills | Status: DC | PRN

## 2022-03-23 MED ORDER — METHOCARBAMOL 500 MG PO TABS
500.0000 mg | ORAL_TABLET | Freq: Once | ORAL | Status: AC
  Administered 2022-03-23: 500 mg via ORAL
  Filled 2022-03-23: qty 1

## 2022-03-23 MED ORDER — METHOCARBAMOL 500 MG PO TABS: 500.0000 mg | ORAL_TABLET | Freq: Three times a day (TID) | ORAL | 0 refills | Status: DC

## 2022-03-23 MED ORDER — FENTANYL CITRATE PF 50 MCG/ML IJ SOSY
50.0000 ug | PREFILLED_SYRINGE | Freq: Once | INTRAMUSCULAR | Status: AC
  Administered 2022-03-23: 50 ug via INTRAVENOUS
  Filled 2022-03-23: qty 1

## 2022-03-23 MED ORDER — SODIUM CHLORIDE 0.9 % IV BOLUS
500.0000 mL | Freq: Once | INTRAVENOUS | Status: AC
  Administered 2022-03-23: 500 mL via INTRAVENOUS

## 2022-03-23 MED ORDER — KETOROLAC TROMETHAMINE 30 MG/ML IJ SOLN
15.0000 mg | Freq: Once | INTRAMUSCULAR | Status: AC
  Administered 2022-03-23: 15 mg via INTRAVENOUS
  Filled 2022-03-23: qty 1

## 2022-03-23 MED ORDER — FENTANYL CITRATE PF 50 MCG/ML IJ SOSY
25.0000 ug | PREFILLED_SYRINGE | Freq: Once | INTRAMUSCULAR | Status: AC
  Administered 2022-03-23: 25 ug via INTRAVENOUS
  Filled 2022-03-23: qty 1

## 2022-03-23 MED ORDER — SODIUM CHLORIDE 0.9 % IV SOLN: INTRAVENOUS | Status: DC

## 2022-03-23 MED ORDER — LORAZEPAM 2 MG/ML IJ SOLN
1.0000 mg | Freq: Once | INTRAMUSCULAR | Status: AC
  Administered 2022-03-23: 1 mg via INTRAVENOUS
  Filled 2022-03-23: qty 1

## 2022-03-23 NOTE — ED Provider Triage Note (Signed)
Emergency Medicine Provider Triage Evaluation Note  Lee Perry , a 46 y.o. male  was evaluated in triage.  Pt complains of burning sensation right buttock that started earlier today.  He took Benadryl initially and then went to his doctor who prescribed Voltaren.  He felt worse with a sensation that his muscles would not relax and developed discomfort extending from his toes to his mid chest bilaterally.  He wonders if he is having a heart attack..  Review of Systems  Positive: Muscle tightness, chest discomfort Negative: Shortness of breath, focal weakness  Physical Exam  BP (!) 161/99   Pulse (!) 102   Temp 98.5 F (36.9 C) (Oral)   Resp (!) 24   Ht 5\' 6"  (1.676 m)   Wt 89.8 kg   SpO2 100%   BMI 31.96 kg/m  Gen:   Awake, visibly anxious Resp:  Normal effort no wheezing, rales or rhonchi.  Good air movement bilaterally MSK:   Moves extremities without difficulty no limitation of movement Other:  Right cheek buttock, with indistinct very mild erythema without sign of puncture or insect bite.  Medical Decision Making  Medically screening exam initiated at 5:55 PM.  Appropriate orders placed.  was informed that the remainder of the evaluation will be completed by another provider, this initial triage assessment does not replace that evaluation, and the importance of remaining in the ED until their evaluation is complete.  Most unifying diagnosis would be spider bite from black widow.  Doubt ACS clinically.  Will check chest x-ray.  Screening labs and monitoring ordered   Everlean Alstrom, MD 03/23/22 1801

## 2022-03-23 NOTE — ED Notes (Signed)
PT inquiring about pt receiving PO muscle relaxer prior to discharge

## 2022-03-23 NOTE — ED Notes (Signed)
Provider notified re: pts pain

## 2022-03-23 NOTE — Discharge Instructions (Signed)
Do not take your hydrocodone while taking the Percocet.  Take the muscle relaxer as directed.  Call Dr. Sharyon Medicus office to arrange follow-up appointment.

## 2022-03-23 NOTE — ED Notes (Signed)
Provider notified re: no improvement in pain

## 2022-03-23 NOTE — ED Triage Notes (Signed)
Pt was bite right buttocks this am red in color, went to Dr.and given Voltaren, currently having chest tightness, has history of anxiety, took 1/2 tab of Xanax w/o any relief.

## 2022-03-24 MED FILL — Oxycodone w/ Acetaminophen Tab 5-325 MG: ORAL | Qty: 6 | Status: AC

## 2022-03-25 NOTE — ED Provider Notes (Signed)
[facility] EMERGENCY DEPARTMENT Provider Note   CSN: [id number] Arrival date & time: 03/23/22  1713     History  Chief Complaint  Patient presents with   Chest Pain    Lee Perry is a 46 y.o. male.   Chest Pain Associated symptoms: abdominal pain   Associated symptoms: no cough, no diaphoresis, no fever, no nausea, no numbness, no palpitations, no shortness of breath, no vomiting and no weakness       Lee Perry is a 46 y.o. male with past medical history of hyperlipidemia and anxiety who presents to the Emergency Department complaining of tightness of his chest and cramping of his abdomen and lower extremities.  Symptoms have been present for several hours.  He states earlier that he was bitten by some type of insect on his right buttock.  He notes having a red mark to the area.  He was seen by PCP and prescribed Voltaren gel, he has not used this medication.  He took over-the-counter Benadryl without improvement.  He also took half of a Xanax also without improvement.  He denies any syncope, diaphoresis, vomiting or significant shortness of breath.  Home Medications Prior to Admission medications   Medication Sig Start Date End Date Taking? Authorizing Provider  methocarbamol (ROBAXIN) 500 MG tablet Take 1 tablet (500 mg total) by mouth 3 (three) times daily. 03/23/22  Yes Lenix Kidd, PA-C  oxyCODONE-acetaminophen (PERCOCET/ROXICET) 5-325 MG tablet Take 1 tablet by mouth every 6 (six) hours as needed for severe pain. 03/23/22  Yes Lemonte Al, PA-C  acetaminophen (TYLENOL) 500 MG tablet Take 500 mg by mouth every 6 (six) hours as needed.    [provider]  ALPRAZolam Prudy Feeler) 0.5 MG tablet Take 0.5 mg by mouth 2 (two) times daily as needed.      [provider]  esomeprazole (NEXIUM) 40 MG capsule Take 40 mg by mouth daily at 12 noon.    [provider]  HYDROcodone-acetaminophen (LORCET) 10-650 MG per tablet Take 1 tablet by mouth  2 times daily at 12 noon and 4 pm.      [provider]      Allergies    Sulfa antibiotics    Review of Systems   Review of Systems  Constitutional:  Negative for appetite change, diaphoresis and fever.  Respiratory:  Positive for chest tightness. Negative for cough and shortness of breath.   Cardiovascular:  Positive for chest pain. Negative for palpitations.  Gastrointestinal:  Positive for abdominal pain. Negative for diarrhea, nausea and vomiting.  Genitourinary:  Negative for difficulty urinating.  Musculoskeletal:  Positive for myalgias.  Skin:  Positive for color change.       Insect bite right buttock  Neurological:  Negative for seizures, syncope, weakness and numbness.   Physical Exam Updated Vital Signs BP (!) 136/95 (BP Location: Left Arm)   Pulse 91   Temp 98.3 F (36.8 C) (Oral)   Resp 14   Ht 5\' 6"  (1.676 m)   Wt 89.8 kg   SpO2 97%   BMI 31.96 kg/m  Physical Exam Vitals and nursing note reviewed.  Constitutional:      Appearance: He is not diaphoretic.     Comments: Patient is anxious appearing  Eyes:     Extraocular Movements: Extraocular movements intact.     Conjunctiva/sclera: Conjunctivae normal.     Pupils: Pupils are equal, round, and reactive to light.  Cardiovascular:     Rate and Rhythm: Normal rate and  regular rhythm.     Pulses: Normal pulses.  Pulmonary:     Effort: Pulmonary effort is normal.  Abdominal:     General: Abdomen is flat. There is no distension.     Palpations: Abdomen is soft.     Tenderness: There is no abdominal tenderness. There is no guarding.  Skin:    General: Skin is warm.     Capillary Refill: Capillary refill takes less than 2 seconds.     Comments: 3 cm area of mild erythema with a centralized pinpoint puncture to the right upper buttock.  No lymphangitis, induration, swelling, pustules or vesicles.  Neurological:     General: No focal deficit present.     Mental Status: He is alert.    ED Results /  Procedures / Treatments   Labs (all labs ordered are listed, but only abnormal results are displayed) Labs Reviewed  BASIC METABOLIC PANEL - Abnormal; Notable for the following components:      Result Value   Glucose, Bld 102 (*)    All other components within normal limits  CBC - Abnormal; Notable for the following components:   WBC 11.5 (*)    All other components within normal limits  TROPONIN I (HIGH SENSITIVITY)  TROPONIN I (HIGH SENSITIVITY)    EKG EKG Interpretation  Date/Time:  Tuesday Mar 23 2022 17:29:15 EDT Ventricular Rate:  94 PR Interval:  130 QRS Duration: 78 QT Interval:  326 QTC Calculation: 407 R Axis:   60 Text Interpretation: Normal sinus rhythm Nonspecific T wave abnormality Abnormal ECG When compared with ECG of 13-Apr-2017 18:28, PREVIOUS ECG IS PRESENT Confirmed by Mancel BaleWentz, Elliott [phone]([id number]) on 03/23/2022 5:43:07 PM  Radiology DG Chest 2 View  Result Date: 03/23/2022 CLINICAL DATA:  Chest pain. EXAM: CHEST - 2 VIEW COMPARISON:  04/13/2017 FINDINGS: The heart size and mediastinal contours are within normal limits. Both lungs are clear. The visualized skeletal structures are unremarkable. IMPRESSION: No active cardiopulmonary disease. Electronically Signed   By: Burman NievesWilliam  Stevens M.D.   On: 03/23/2022 17:40    Procedures Procedures    Medications Ordered in ED Medications  ketorolac (TORADOL) 30 MG/ML injection 15 mg (15 mg Intravenous Given 03/23/22 1901)  sodium chloride 0.9 % bolus 500 mL (0 mLs Intravenous Stopped 03/23/22 2352)  LORazepam (ATIVAN) injection 1 mg (1 mg Intravenous Given 03/23/22 1901)  fentaNYL (SUBLIMAZE) injection 50 mcg (50 mcg Intravenous Given 03/23/22 2019)  fentaNYL (SUBLIMAZE) injection 25 mcg (25 mcg Intravenous Given 03/23/22 2251)  methocarbamol (ROBAXIN) tablet 500 mg (500 mg Oral Given 03/23/22 2313)    ED Course/ Medical Decision Making/ A&P                           Medical Decision Making Patient here for evaluation of  chest pain, abdominal pain/tightness and cramping of his lower extremities.  States he was bitten earlier by some type of unknown insect.  Has a slightly reddened area to the right buttock.  No history of cardiac disease.  Symptoms possibly related to spider bite from black widow although this is uncertain given that patient did not see an insect or spider.  Doubt ACS.  No shortness of breath and vital signs are reassuring so I doubt PE.  Will check labs, EKG and troponins.  Patient reassured.  Will treat symptomatically and observe  Amount and/or Complexity of Data Reviewed External Data Reviewed: labs. Labs: ordered.    Details: Labs unremarkable.  Delta  troponin unchanged Radiology: ordered.    Details: The chest x-ray without acute cardiopulmonary process ECG/medicine tests: ordered.    Details: EKG shows normal sinus  Risk Prescription drug management.   On recheck, patient resting comfortably.  His symptoms have improved after IV medication and IV fluids.  His symptoms may have been related to spider bite from black widow although this is not definite.  His symptoms have improved during his ER course, I feel he is appropriate for discharge home, all questions were answered.        Final Clinical Impression(s) / ED Diagnoses Final diagnoses:  Insect bite, unspecified site, initial encounter  Atypical chest pain  Anxiety    Rx / DC Orders ED Discharge Orders          Ordered    methocarbamol (ROBAXIN) 500 MG tablet  3 times daily        03/23/22 2302    oxyCODONE-acetaminophen (PERCOCET/ROXICET) 5-325 MG tablet  Every 6 hours PRN        03/23/22 2303              Pauline Aus, PA-C 03/25/22 1451    Mancel Bale, MD 03/27/22 2351

## 2022-04-02 DIAGNOSIS — I10 Essential (primary) hypertension: Secondary | ICD-10-CM | POA: Diagnosis not present

## 2022-04-02 DIAGNOSIS — M47816 Spondylosis without myelopathy or radiculopathy, lumbar region: Secondary | ICD-10-CM | POA: Diagnosis not present

## 2022-04-02 DIAGNOSIS — W57XXXA Bitten or stung by nonvenomous insect and other nonvenomous arthropods, initial encounter: Secondary | ICD-10-CM | POA: Diagnosis not present

## 2022-04-02 DIAGNOSIS — G894 Chronic pain syndrome: Secondary | ICD-10-CM | POA: Diagnosis not present

## 2022-06-09 DIAGNOSIS — I10 Essential (primary) hypertension: Secondary | ICD-10-CM | POA: Diagnosis not present

## 2022-06-09 DIAGNOSIS — M47816 Spondylosis without myelopathy or radiculopathy, lumbar region: Secondary | ICD-10-CM | POA: Diagnosis not present

## 2022-06-09 DIAGNOSIS — L03313 Cellulitis of chest wall: Secondary | ICD-10-CM | POA: Diagnosis not present

## 2022-06-09 DIAGNOSIS — G894 Chronic pain syndrome: Secondary | ICD-10-CM | POA: Diagnosis not present

## 2022-08-31 DIAGNOSIS — E782 Mixed hyperlipidemia: Secondary | ICD-10-CM | POA: Diagnosis not present

## 2022-08-31 DIAGNOSIS — Z6830 Body mass index (BMI) 30.0-30.9, adult: Secondary | ICD-10-CM | POA: Diagnosis not present

## 2022-08-31 DIAGNOSIS — G894 Chronic pain syndrome: Secondary | ICD-10-CM | POA: Diagnosis not present

## 2022-08-31 DIAGNOSIS — Z0001 Encounter for general adult medical examination with abnormal findings: Secondary | ICD-10-CM | POA: Diagnosis not present

## 2022-08-31 DIAGNOSIS — Z1331 Encounter for screening for depression: Secondary | ICD-10-CM | POA: Diagnosis not present

## 2022-08-31 DIAGNOSIS — E6609 Other obesity due to excess calories: Secondary | ICD-10-CM | POA: Diagnosis not present

## 2022-08-31 DIAGNOSIS — E291 Testicular hypofunction: Secondary | ICD-10-CM | POA: Diagnosis not present

## 2022-08-31 DIAGNOSIS — F419 Anxiety disorder, unspecified: Secondary | ICD-10-CM | POA: Diagnosis not present

## 2022-08-31 DIAGNOSIS — E538 Deficiency of other specified B group vitamins: Secondary | ICD-10-CM | POA: Diagnosis not present

## 2022-08-31 DIAGNOSIS — I10 Essential (primary) hypertension: Secondary | ICD-10-CM | POA: Diagnosis not present

## 2022-08-31 DIAGNOSIS — K219 Gastro-esophageal reflux disease without esophagitis: Secondary | ICD-10-CM | POA: Diagnosis not present

## 2022-08-31 DIAGNOSIS — M47816 Spondylosis without myelopathy or radiculopathy, lumbar region: Secondary | ICD-10-CM | POA: Diagnosis not present

## 2022-08-31 DIAGNOSIS — R7309 Other abnormal glucose: Secondary | ICD-10-CM | POA: Diagnosis not present

## 2023-10-14 ENCOUNTER — Encounter: Payer: Self-pay | Admitting: Neurology

## 2023-10-14 ENCOUNTER — Ambulatory Visit: Payer: PRIVATE HEALTH INSURANCE | Admitting: Neurology

## 2023-10-14 VITALS — BP 128/83 | HR 91 | Ht 67.0 in | Wt 185.2 lb

## 2023-10-14 DIAGNOSIS — M5412 Radiculopathy, cervical region: Secondary | ICD-10-CM | POA: Diagnosis not present

## 2023-10-14 NOTE — Progress Notes (Signed)
Chief Complaint  Patient presents with   New Patient (Initial Visit)    Rm 15, alone.  R arm pins/needles, numbness for 6-45months/  L below arm no numbness, has pain.  Afraid of needles he says.       ASSESSMENT AND PLAN  Lee Perry is a 47 y.o. male   Right cervical radiculopathy  MRI of cervical spine  EMG nerve conduction study  DIAGNOSTIC DATA (LABS, IMAGING, TESTING) - I reviewed patient records, labs, notes, testing and imaging myself where available.   MEDICAL HISTORY:  Lee Perry is a 47-year right-handed male, seen in request by his primary care from [facility] Elfredia Nevins for evaluation of right hand numbness, initial evaluation October 14, 2023  History is obtained from the patient and review of electronic medical records. I personally reviewed pertinent available imaging films in PACS.   PMHx of  Chronic low back pain, taking Percocet 5/325mg  up to 3 times a day by Dr. Sherwood Gambler Anxiety  He works as a Psychologist, occupational, right-handed, since beginning of 2024, he noticed intermittent numbness, often triggered by hyperextend his neck, felt radiating numbness from right neck lateral arm, forearm, lateral 3 fingers, initially is only intermittent, mild multiple episode a day, he denied persistent weakness or sensory loss, abnormal sensation would disappear if he changes position  Occasionally left arm subjective weakness but transient, no gait abnormality no bowel or bladder incontinence,  He suffered severe anxiety, untreated, occasionally panic attacks, took BuSpar 7.5 mg twice a day for few months, did help his anxiety, but he reported worsening headaches, does not want to try any medications  His headache are lateralized pressure, pounding headache with light noise sensitivity, has migraine features.     PHYSICAL EXAM:   Vitals:   10/14/23 1026  BP: 128/83  Pulse: 91  Weight: 185 lb 3.2 oz (84 kg)  Height: 5\' 7"  (1.702 m)      Body mass index is 29.01 kg/m.  PHYSICAL EXAMNIATION:  Gen: NAD, conversant, well nourised, well groomed                     Cardiovascular: Regular rate rhythm, no peripheral edema, warm, nontender. Eyes: Conjunctivae clear without exudates or hemorrhage Neck: Supple, no carotid bruits. Pulmonary: Clear to auscultation bilaterally   NEUROLOGICAL EXAM:  MENTAL STATUS: Speech/cognition: Awake, alert, oriented to history taking and casual conversation CRANIAL NERVES: CN II: Visual fields are full to confrontation. Pupils are round equal and briskly reactive to light. CN III, IV, VI: extraocular movement are normal. No ptosis. CN V: Facial sensation is intact to light touch CN VII: Face is symmetric with normal eye closure  CN VIII: Hearing is normal to causal conversation. CN IX, X: Phonation is normal. CN XI: Head turning and shoulder shrug are intact  MOTOR: There is no pronator drift of out-stretched arms. Muscle bulk and tone are normal. Muscle strength is normal.  REFLEXES: Reflexes at the biceps (R2/L2+), symmetric at triceps, knees, and ankles. Plantar responses are flexor.  SENSORY: Intact to light touch, pinprick and vibratory sensation are intact in fingers and toes.  COORDINATION: There is no trunk or limb dysmetria noted.  GAIT/STANCE: Posture is normal. Gait is steady with normal steps, base, arm swing, and turning. Heel and toe walking are normal. Tandem gait is normal.  Romberg is absent.  REVIEW OF SYSTEMS:  Full 14 system review of systems performed and notable only for as above All other  review of systems were negative.   ALLERGIES: Allergies  Allergen Reactions   Sulfa Antibiotics     HOME MEDICATIONS: Current Outpatient Medications  Medication Sig Dispense Refill   ALPRAZolam (XANAX) 0.5 MG tablet Take 0.5 mg by mouth 2 (two) times daily as needed.       esomeprazole (NEXIUM) 40 MG capsule Take 40 mg by mouth daily at 12 noon.      HYDROcodone-acetaminophen (LORCET) 10-650 MG per tablet Take 1 tablet by mouth 2 times daily at 12 noon and 4 pm.       methocarbamol (ROBAXIN) 500 MG tablet Take 1 tablet (500 mg total) by mouth 3 (three) times daily. (Patient not taking: Reported on 10/14/2023) 21 tablet 0   oxyCODONE-acetaminophen (PERCOCET/ROXICET) 5-325 MG tablet Take 1 tablet by mouth every 6 (six) hours as needed for severe pain. (Patient not taking: Reported on 10/14/2023) 6 tablet 0   No current facility-administered medications for this visit.    PAST MEDICAL HISTORY: Past Medical History:  Diagnosis Date   Anxiety    Hyperlipemia     PAST SURGICAL HISTORY: Past Surgical History:  Procedure Laterality Date   SKIN CANCER EXCISION      FAMILY HISTORY: Family History  Problem Relation Age of Onset   Diabetes Mother    Diabetes Father    Heart attack Father     SOCIAL HISTORY: Social History   Socioeconomic History   Marital status: Married    Spouse name: Not on file   Number of children: Not on file   Years of education: Not on file   Highest education level: Not on file  Occupational History   Not on file  Tobacco Use   Smoking status: Some Days    Current packs/day: 2.00    Types: Cigarettes   Smokeless tobacco: Never  Vaping Use   Vaping status: Every Day  Substance and Sexual Activity   Alcohol use: No   Drug use: No   Sexual activity: Not on file  Other Topics Concern   Not on file  Social History Narrative   Caffiene sundrop 3-4 day   Welding occupation.     Social Drivers of Corporate investment banker Strain: Not on file  Food Insecurity: Not on file  Transportation Needs: Not on file  Physical Activity: Not on file  Stress: Not on file  Social Connections: Not on file  Intimate Partner Violence: Not on file      Levert Feinstein, M.D. Ph.D.  [facility] [address], [address] [place], [place] [address] Ph: [phone] Fax: [phone]  CC:   Elfredia Nevins, MD [address] [place],  [place] [address]  Elfredia Nevins, MD  w

## 2023-10-31 ENCOUNTER — Ambulatory Visit (HOSPITAL_COMMUNITY)
Admission: RE | Admit: 2023-10-31 | Discharge: 2023-10-31 | Disposition: A | Discharge status: Home / Self Care | Payer: PRIVATE HEALTH INSURANCE | Source: Ambulatory Visit | Attending: Neurology | Admitting: Neurology

## 2023-10-31 DIAGNOSIS — M5412 Radiculopathy, cervical region: Secondary | ICD-10-CM | POA: Insufficient documentation

## 2023-11-14 ENCOUNTER — Telehealth: Payer: Self-pay | Admitting: Neurology

## 2023-11-14 NOTE — Telephone Encounter (Signed)
 Pt canceled appt.

## 2023-12-14 ENCOUNTER — Other Ambulatory Visit: Payer: Self-pay | Admitting: Orthopedic Surgery

## 2023-12-21 ENCOUNTER — Encounter: Payer: PRIVATE HEALTH INSURANCE | Admitting: Neurology

## 2023-12-23 NOTE — Pre-Procedure Instructions (Signed)
Surgical Instructions   Your procedure is scheduled on December 28, 2023. Report to [facility] Main Entrance "A" at 5:30 A.M., then check in with the Admitting office. Any questions or running late day of surgery: call [phone]  Questions prior to your surgery date: call [phone], Monday-Friday, 8am-4pm. If you experience any cold or flu symptoms such as cough, fever, chills, shortness of breath, etc. between now and your scheduled surgery, please notify us at the above number.     Remember:  Do not eat after midnight the night before your surgery  You may drink clear liquids until 4:30 AM the morning of your surgery.   Clear liquids allowed are: Water, Non-Citrus Juices (without pulp), Carbonated Beverages, Clear Tea (no milk, honey, etc.), Black Coffee Only (NO MILK, CREAM OR POWDERED CREAMER of any kind), and Gatorade.  Patient Instructions  The night before surgery:  No food after midnight. ONLY clear liquids after midnight  The day of surgery (if you do NOT have diabetes):  Drink ONE (1) Pre-Surgery Clear Ensure by 4:30 AM the morning of surgery. Drink in one sitting. Do not sip.  This drink was given to you during your hospital  pre-op appointment visit.  Nothing else to drink after completing the  Pre-Surgery Clear Ensure.         If you have questions, please contact your surgeon's office.   Take these medicines the morning of surgery with A SIP OF WATER: HYDROcodone-acetaminophen (NORCO) - may take if needed   One week prior to surgery, STOP taking any Aspirin (unless otherwise instructed by your surgeon) Aleve, Naproxen, Ibuprofen, Motrin, Advil, Goody's, BC's, all herbal medications, fish oil, and non-prescription vitamins.                     Do NOT Smoke (Tobacco/Vaping) for 24 hours prior to your procedure.  If you use a CPAP at night, you may bring your mask/headgear for your overnight stay.   You will be asked to remove any contacts, glasses,  piercing's, hearing aid's, dentures/partials prior to surgery. Please bring cases for these items if needed.    Patients discharged the day of surgery will not be allowed to drive home, and someone needs to stay with them for 24 hours.  SURGICAL WAITING ROOM VISITATION Patients may have no more than 2 support people in the waiting area - these visitors may rotate.   Pre-op nurse will coordinate an appropriate time for 1 ADULT support person, who may not rotate, to accompany patient in pre-op.  Children under the age of 26 must have an adult with them who is not the patient and must remain in the main waiting area with an adult.  If the patient needs to stay at the hospital during part of their recovery, the visitor guidelines for inpatient rooms apply.  Please refer to the [facility] website for the visitor guidelines for any additional information.   If you received a COVID test during your pre-op visit  it is requested that you wear a mask when out in public, stay away from anyone that may not be feeling well and notify your surgeon if you develop symptoms. If you have been in contact with anyone that has tested positive in the last 10 days please notify you surgeon.      Pre-operative 5 CHG Bathing Instructions   You can play a key role in reducing the risk of infection after surgery. Your skin needs to be as free  of germs as possible. You can reduce the number of germs on your skin by washing with CHG (chlorhexidine gluconate) soap before surgery. CHG is an antiseptic soap that kills germs and continues to kill germs even after washing.   DO NOT use if you have an allergy to chlorhexidine/CHG or antibacterial soaps. If your skin becomes reddened or irritated, stop using the CHG and notify one of our RNs at [phone].   Please shower with the CHG soap starting 4 days before surgery using the following schedule:     Please keep in mind the following:  DO NOT shave, including legs  and underarms, starting the day of your first shower.   You may shave your face at any point before/day of surgery.  Place clean sheets on your bed the day you start using CHG soap. Use a clean washcloth (not used since being washed) for each shower. DO NOT sleep with pets once you start using the CHG.   CHG Shower Instructions:  Wash your face and private area with normal soap. If you choose to wash your hair, wash first with your normal shampoo.  After you use shampoo/soap, rinse your hair and body thoroughly to remove shampoo/soap residue.  Turn the water OFF and apply about 3 tablespoons (45 ml) of CHG soap to a CLEAN washcloth.  Apply CHG soap ONLY FROM YOUR NECK DOWN TO YOUR TOES (washing for 3-5 minutes)  DO NOT use CHG soap on face, private areas, open wounds, or sores.  Pay special attention to the area where your surgery is being performed.  If you are having back surgery, having someone wash your back for you may be helpful. Wait 2 minutes after CHG soap is applied, then you may rinse off the CHG soap.  Pat dry with a clean towel  Put on clean clothes/pajamas   If you choose to wear lotion, please use ONLY the CHG-compatible lotions that are listed below.  Additional instructions for the day of surgery: DO NOT APPLY any lotions, deodorants, cologne, or perfumes.   Do not bring valuables to the hospital. [facility] is not responsible for any belongings/valuables. Do not wear nail polish, gel polish, artificial nails, or any other type of covering on natural nails (fingers and toes) Do not wear jewelry or makeup Put on clean/comfortable clothes.  Please brush your teeth.  Ask your nurse before applying any prescription medications to the skin.     CHG Compatible Lotions   Aveeno Moisturizing lotion  Cetaphil Moisturizing Cream  Cetaphil Moisturizing Lotion  Clairol Herbal Essence Moisturizing Lotion, Dry Skin  Clairol Herbal Essence Moisturizing Lotion, Extra Dry Skin   Clairol Herbal Essence Moisturizing Lotion, Normal Skin  Curel Age Defying Therapeutic Moisturizing Lotion with Alpha Hydroxy  Curel Extreme Care Body Lotion  Curel Soothing Hands Moisturizing Hand Lotion  Curel Therapeutic Moisturizing Cream, Fragrance-Free  Curel Therapeutic Moisturizing Lotion, Fragrance-Free  Curel Therapeutic Moisturizing Lotion, Original Formula  Eucerin Daily Replenishing Lotion  Eucerin Dry Skin Therapy Plus Alpha Hydroxy Crme  Eucerin Dry Skin Therapy Plus Alpha Hydroxy Lotion  Eucerin Original Crme  Eucerin Original Lotion  Eucerin Plus Crme Eucerin Plus Lotion  Eucerin TriLipid Replenishing Lotion  Keri Anti-Bacterial Hand Lotion  Keri Deep Conditioning Original Lotion Dry Skin Formula Softly Scented  Keri Deep Conditioning Original Lotion, Fragrance Free Sensitive Skin Formula  Keri Lotion Fast Absorbing Fragrance Free Sensitive Skin Formula  Keri Lotion Fast Absorbing Softly Scented Dry Skin Formula  Keri Original Lotion  [employer]  Skin Renewal Lotion Keri Silky Smooth Lotion  Keri Silky Smooth Sensitive Skin Lotion  Nivea Body Creamy Conditioning Oil  Nivea Body Extra Enriched Lotion  Nivea Body Original Lotion  Nivea Body Sheer Moisturizing Lotion Nivea Crme  Nivea Skin Firming Lotion  NutraDerm 30 Skin Lotion  NutraDerm Skin Lotion  NutraDerm Therapeutic Skin Cream  NutraDerm Therapeutic Skin Lotion  ProShield Protective Hand Cream  Provon moisturizing lotion  Please read over the following fact sheets that you were given.

## 2023-12-26 ENCOUNTER — Encounter (HOSPITAL_COMMUNITY)
Admission: RE | Admit: 2023-12-26 | Discharge: 2023-12-26 | Disposition: A | Discharge status: Home / Self Care | Payer: 59 | Source: Ambulatory Visit | Attending: Orthopedic Surgery | Admitting: Orthopedic Surgery

## 2023-12-26 ENCOUNTER — Other Ambulatory Visit: Payer: Self-pay

## 2023-12-26 ENCOUNTER — Encounter (HOSPITAL_COMMUNITY): Payer: Self-pay

## 2023-12-26 VITALS — BP 143/78 | HR 92 | Temp 98.6°F | Resp 18 | Ht 67.0 in | Wt 183.7 lb

## 2023-12-26 DIAGNOSIS — Z01818 Encounter for other preprocedural examination: Secondary | ICD-10-CM

## 2023-12-26 DIAGNOSIS — Z01812 Encounter for preprocedural laboratory examination: Secondary | ICD-10-CM | POA: Diagnosis not present

## 2023-12-26 HISTORY — DX: Personal history of other diseases of the digestive system: Z87.19

## 2023-12-26 HISTORY — DX: Pneumonia, unspecified organism: J18.9

## 2023-12-26 HISTORY — DX: Gastro-esophageal reflux disease without esophagitis: K21.9

## 2023-12-26 HISTORY — DX: Personal history of urinary calculi: Z87.442

## 2023-12-26 LAB — CBC
HCT: 46.4 % (ref 39.0–52.0)
Hemoglobin: 16.1 g/dL (ref 13.0–17.0)
MCH: 29.9 pg (ref 26.0–34.0)
MCHC: 34.7 g/dL (ref 30.0–36.0)
MCV: 86.1 fL (ref 80.0–100.0)
Platelets: 250 10*3/uL (ref 150–400)
RBC: 5.39 MIL/uL (ref 4.22–5.81)
RDW: 12.9 % (ref 11.5–15.5)
WBC: 6.8 10*3/uL (ref 4.0–10.5)
nRBC: 0 % (ref 0.0–0.2)

## 2023-12-26 LAB — BASIC METABOLIC PANEL
Anion gap: 10 (ref 5–15)
BUN: 7 mg/dL (ref 6–20)
CO2: 25 mmol/L (ref 22–32)
Calcium: 9.6 mg/dL (ref 8.9–10.3)
Chloride: 103 mmol/L (ref 98–111)
Creatinine, Ser: 1.18 mg/dL (ref 0.61–1.24)
GFR, Estimated: >60 mL/min (ref >60)
Glucose, Bld: 101 mg/dL — ABNORMAL HIGH (ref 70–99)
Potassium: 3.6 mmol/L (ref 3.5–5.1)
Sodium: 138 mmol/L (ref 135–145)

## 2023-12-26 LAB — SURGICAL PCR SCREEN
MRSA, PCR: NEGATIVE
Staphylococcus aureus: NEGATIVE

## 2023-12-26 NOTE — Progress Notes (Signed)
 PCP - Dr. Elfredia Nevins Cardiologist - Denies  PPM/ICD - Denies Device Orders - n/a Rep Notified - n/a  Chest x-ray - n/a EKG - Denies Stress Test - Denies ECHO - Denies Cardiac Cath - Denies  Sleep Study - Denies CPAP - n/a  No DM  Last dose of GLP1 agonist- n/a GLP1 instructions: n/a  Blood Thinner Instructions: n/a Aspirin Instructions: n/a  ERAS Protcol - Clear liquids until 0430 morning of surgery PRE-SURGERY Ensure or G2- Ensure given to pt with instructions  COVID TEST- n/a   Anesthesia review: No.   Patient denies shortness of breath, fever, cough and chest pain at PAT appointment. Pt denies any respiratory illness/infection in the last two months.   All instructions explained to the patient, with a verbal understanding of the material. Patient agrees to go over the instructions while at home for a better understanding. Patient also instructed to self quarantine after being tested for COVID-19. The opportunity to ask questions was provided.

## 2023-12-28 ENCOUNTER — Ambulatory Visit (HOSPITAL_COMMUNITY): Payer: 59

## 2023-12-28 ENCOUNTER — Encounter (HOSPITAL_COMMUNITY)
Admission: RE | Disposition: A | Discharge status: Home / Self Care | Payer: Self-pay | Source: Home / Self Care | Attending: Orthopedic Surgery

## 2023-12-28 ENCOUNTER — Other Ambulatory Visit: Payer: Self-pay

## 2023-12-28 ENCOUNTER — Encounter (HOSPITAL_COMMUNITY): Payer: Self-pay | Admitting: Orthopedic Surgery

## 2023-12-28 ENCOUNTER — Ambulatory Visit (HOSPITAL_BASED_OUTPATIENT_CLINIC_OR_DEPARTMENT_OTHER): Payer: 59

## 2023-12-28 ENCOUNTER — Ambulatory Visit (HOSPITAL_COMMUNITY)
Admission: RE | Admit: 2023-12-28 | Discharge: 2023-12-28 | Disposition: A | Discharge status: Home / Self Care | Payer: 59 | Attending: Orthopedic Surgery | Admitting: Orthopedic Surgery

## 2023-12-28 DIAGNOSIS — F17290 Nicotine dependence, other tobacco product, uncomplicated: Secondary | ICD-10-CM | POA: Insufficient documentation

## 2023-12-28 DIAGNOSIS — M4802 Spinal stenosis, cervical region: Secondary | ICD-10-CM

## 2023-12-28 DIAGNOSIS — M5412 Radiculopathy, cervical region: Secondary | ICD-10-CM | POA: Diagnosis not present

## 2023-12-28 HISTORY — PX: ANTERIOR CERVICAL DECOMP/DISCECTOMY FUSION: SHX1161

## 2023-12-28 SURGERY — ANTERIOR CERVICAL DECOMPRESSION/DISCECTOMY FUSION 2 LEVELS
Anesthesia: General | Site: Neck

## 2023-12-28 MED ORDER — DEXAMETHASONE SODIUM PHOSPHATE 10 MG/ML IJ SOLN
INTRAMUSCULAR | Status: DC | PRN
  Administered 2023-12-28: 10 mg via INTRAVENOUS

## 2023-12-28 MED ORDER — HYDROMORPHONE HCL 1 MG/ML IJ SOLN
INTRAMUSCULAR | Status: DC | PRN
  Administered 2023-12-28 (×2): .25 mg via INTRAVENOUS

## 2023-12-28 MED ORDER — HYDROCODONE-ACETAMINOPHEN 5-325 MG PO TABS: 1.0000 | ORAL_TABLET | Freq: Four times a day (QID) | ORAL | 0 refills | Status: AC | PRN

## 2023-12-28 MED ORDER — KETAMINE HCL 50 MG/ML IJ SOLN
INTRAMUSCULAR | Status: DC | PRN
  Administered 2023-12-28: 15 mg via INTRAMUSCULAR
  Administered 2023-12-28: 10 mg via INTRAMUSCULAR

## 2023-12-28 MED ORDER — FENTANYL CITRATE (PF) 250 MCG/5ML IJ SOLN
INTRAMUSCULAR | Status: DC | PRN
  Administered 2023-12-28: 50 ug via INTRAVENOUS
  Administered 2023-12-28: 150 ug via INTRAVENOUS
  Administered 2023-12-28: 50 ug via INTRAVENOUS

## 2023-12-28 MED ORDER — MIDAZOLAM HCL 2 MG/2ML IJ SOLN
INTRAMUSCULAR | Status: DC | PRN
  Administered 2023-12-28 (×2): 2 mg via INTRAVENOUS

## 2023-12-28 MED ORDER — MIDAZOLAM HCL 2 MG/2ML IJ SOLN
INTRAMUSCULAR | Status: AC
  Filled 2023-12-28: qty 2

## 2023-12-28 MED ORDER — LACTATED RINGERS IV SOLN
INTRAVENOUS | Status: DC
Start: 2023-12-28 — End: 2023-12-28

## 2023-12-28 MED ORDER — ACETAMINOPHEN 10 MG/ML IV SOLN: 1000.0000 mg | Freq: Once | INTRAVENOUS | Status: DC | PRN

## 2023-12-28 MED ORDER — ROCURONIUM BROMIDE 10 MG/ML (PF) SYRINGE
PREFILLED_SYRINGE | INTRAVENOUS | Status: AC
Start: 2023-12-28 — End: ?
  Filled 2023-12-28: qty 10

## 2023-12-28 MED ORDER — LIDOCAINE 2% (20 MG/ML) 5 ML SYRINGE
INTRAMUSCULAR | Status: DC | PRN
  Administered 2023-12-28: 100 mg via INTRAVENOUS

## 2023-12-28 MED ORDER — ROCURONIUM BROMIDE 10 MG/ML (PF) SYRINGE
PREFILLED_SYRINGE | INTRAVENOUS | Status: DC | PRN
  Administered 2023-12-28: 60 mg via INTRAVENOUS
  Administered 2023-12-28: 20 mg via INTRAVENOUS
  Administered 2023-12-28: 10 mg via INTRAVENOUS

## 2023-12-28 MED ORDER — MIDAZOLAM HCL 2 MG/2ML IJ SOLN
INTRAMUSCULAR | Status: AC
Start: 2023-12-28 — End: ?
  Filled 2023-12-28: qty 2

## 2023-12-28 MED ORDER — METHOCARBAMOL 500 MG PO TABS: 500.0000 mg | ORAL_TABLET | Freq: Four times a day (QID) | ORAL | 2 refills | Status: AC

## 2023-12-28 MED ORDER — FENTANYL CITRATE (PF) 250 MCG/5ML IJ SOLN
INTRAMUSCULAR | Status: AC
  Filled 2023-12-28: qty 5

## 2023-12-28 MED ORDER — HYDROCODONE-ACETAMINOPHEN 5-325 MG PO TABS
1.0000 | ORAL_TABLET | Freq: Once | ORAL | Status: AC
  Administered 2023-12-28: 1 via ORAL

## 2023-12-28 MED ORDER — ORAL CARE MOUTH RINSE: 15.0000 mL | Freq: Once | OROMUCOSAL | Status: AC

## 2023-12-28 MED ORDER — FENTANYL CITRATE (PF) 100 MCG/2ML IJ SOLN
INTRAMUSCULAR | Status: AC
  Filled 2023-12-28: qty 2

## 2023-12-28 MED ORDER — LIDOCAINE 2% (20 MG/ML) 5 ML SYRINGE
INTRAMUSCULAR | Status: AC
  Filled 2023-12-28: qty 5

## 2023-12-28 MED ORDER — PHENYLEPHRINE 80 MCG/ML (10ML) SYRINGE FOR IV PUSH (FOR BLOOD PRESSURE SUPPORT)
PREFILLED_SYRINGE | INTRAVENOUS | Status: AC
  Filled 2023-12-28: qty 10

## 2023-12-28 MED ORDER — FENTANYL CITRATE (PF) 100 MCG/2ML IJ SOLN
25.0000 ug | INTRAMUSCULAR | Status: DC | PRN
  Administered 2023-12-28 (×2): 50 ug via INTRAVENOUS

## 2023-12-28 MED ORDER — CEFAZOLIN SODIUM-DEXTROSE 2-4 GM/100ML-% IV SOLN
2.0000 g | INTRAVENOUS | Status: AC
  Administered 2023-12-28: 2 g via INTRAVENOUS
  Filled 2023-12-28: qty 100

## 2023-12-28 MED ORDER — ACETAMINOPHEN 500 MG PO TABS: 1000.0000 mg | ORAL_TABLET | Freq: Once | ORAL | Status: DC

## 2023-12-28 MED ORDER — POVIDONE-IODINE 7.5 % EX SOLN: Freq: Once | CUTANEOUS | Status: DC

## 2023-12-28 MED ORDER — SUGAMMADEX SODIUM 200 MG/2ML IV SOLN
INTRAVENOUS | Status: AC
  Filled 2023-12-28: qty 2

## 2023-12-28 MED ORDER — PHENYLEPHRINE 80 MCG/ML (10ML) SYRINGE FOR IV PUSH (FOR BLOOD PRESSURE SUPPORT)
PREFILLED_SYRINGE | INTRAVENOUS | Status: DC | PRN
  Administered 2023-12-28 (×2): 80 ug via INTRAVENOUS

## 2023-12-28 MED ORDER — ONDANSETRON HCL 4 MG/2ML IJ SOLN
INTRAMUSCULAR | Status: AC
  Filled 2023-12-28: qty 2

## 2023-12-28 MED ORDER — PROPOFOL 10 MG/ML IV BOLUS
INTRAVENOUS | Status: AC
  Filled 2023-12-28: qty 20

## 2023-12-28 MED ORDER — DEXMEDETOMIDINE HCL IN NACL 80 MCG/20ML IV SOLN
INTRAVENOUS | Status: DC | PRN
  Administered 2023-12-28: 8 ug via INTRAVENOUS

## 2023-12-28 MED ORDER — SUGAMMADEX SODIUM 200 MG/2ML IV SOLN
INTRAVENOUS | Status: DC | PRN
  Administered 2023-12-28: 200 mg via INTRAVENOUS

## 2023-12-28 MED ORDER — HYDROCODONE-ACETAMINOPHEN 5-325 MG PO TABS
ORAL_TABLET | ORAL | Status: AC
  Filled 2023-12-28: qty 1

## 2023-12-28 MED ORDER — ONDANSETRON HCL 4 MG/2ML IJ SOLN
INTRAMUSCULAR | Status: DC | PRN
  Administered 2023-12-28: 4 mg via INTRAVENOUS

## 2023-12-28 MED ORDER — HYDROMORPHONE HCL 1 MG/ML IJ SOLN
INTRAMUSCULAR | Status: AC
  Filled 2023-12-28: qty 0.5

## 2023-12-28 MED ORDER — OXYCODONE HCL 5 MG PO TABS: 5.0000 mg | ORAL_TABLET | Freq: Once | ORAL | Status: DC | PRN

## 2023-12-28 MED ORDER — PROPOFOL 10 MG/ML IV BOLUS
INTRAVENOUS | Status: DC | PRN
  Administered 2023-12-28: 150 mg via INTRAVENOUS

## 2023-12-28 MED ORDER — DROPERIDOL 2.5 MG/ML IJ SOLN: 0.6250 mg | Freq: Once | INTRAMUSCULAR | Status: DC | PRN

## 2023-12-28 MED ORDER — KETAMINE HCL 50 MG/5ML IJ SOSY
PREFILLED_SYRINGE | INTRAMUSCULAR | Status: AC
Start: 2023-12-28 — End: ?
  Filled 2023-12-28: qty 5

## 2023-12-28 MED ORDER — THROMBIN 20000 UNITS EX SOLR
CUTANEOUS | Status: AC
  Filled 2023-12-28: qty 20000

## 2023-12-28 MED ORDER — CHLORHEXIDINE GLUCONATE 0.12 % MT SOLN
15.0000 mL | Freq: Once | OROMUCOSAL | Status: AC
  Administered 2023-12-28: 15 mL via OROMUCOSAL
  Filled 2023-12-28: qty 15

## 2023-12-28 MED ORDER — THROMBIN 20000 UNITS EX SOLR
CUTANEOUS | Status: DC | PRN
  Administered 2023-12-28: 20000 [IU] via TOPICAL

## 2023-12-28 MED ORDER — OXYCODONE HCL 5 MG/5ML PO SOLN: 5.0000 mg | Freq: Once | ORAL | Status: DC | PRN

## 2023-12-28 MED ORDER — BUPIVACAINE-EPINEPHRINE 0.25% -1:200000 IJ SOLN
INTRAMUSCULAR | Status: DC | PRN
  Administered 2023-12-28: 6 mL

## 2023-12-28 MED ORDER — 0.9 % SODIUM CHLORIDE (POUR BTL) OPTIME
TOPICAL | Status: DC | PRN
  Administered 2023-12-28: 1000 mL

## 2023-12-28 SURGICAL SUPPLY — 66 items
BAG COUNTER SPONGE SURGICOUNT (BAG) ×1 IMPLANT
BENZOIN TINCTURE PRP APPL 2/3 (GAUZE/BANDAGES/DRESSINGS) ×1 IMPLANT
BIT DRILL NEURO 2X3.1 SFT TUCH (MISCELLANEOUS) ×1 IMPLANT
BIT DRILL SRG 14X2.2XFLT CHK (BIT) IMPLANT
BIT DRL SRG 14X2.2XFLT CHK (BIT) ×1 IMPLANT
BLADE CLIPPER SURG (BLADE) ×1 IMPLANT
BLADE SURG 15 STRL LF DISP TIS (BLADE) ×1 IMPLANT
BONE VIVIGEN FORMABLE 1.3CC (Bone Implant) ×1 IMPLANT
COLLAR CERV LO CONTOUR FIRM DE (SOFTGOODS) IMPLANT
CORD BIPOLAR FORCEPS 12FT (ELECTRODE) ×1 IMPLANT
COVER SURGICAL LIGHT HANDLE (MISCELLANEOUS) ×1 IMPLANT
DEVICE ENDSKLTN IMPLANT SM 7MM (Cage) IMPLANT
DRAIN JACKSON RD 7FR 3/32 (WOUND CARE) IMPLANT
DRAPE C-ARM 42X72 X-RAY (DRAPES) ×1 IMPLANT
DRAPE POUCH INSTRU U-SHP 10X18 (DRAPES) ×1 IMPLANT
DRAPE SURG 17X23 STRL (DRAPES) ×4 IMPLANT
DRILL NEURO 2X3.1 SOFT TOUCH (MISCELLANEOUS) ×1 IMPLANT
DURAPREP 26ML APPLICATOR (WOUND CARE) ×1 IMPLANT
ELECT COATED BLADE 2.86 ST (ELECTRODE) ×1 IMPLANT
ELECT REM PT RETURN 9FT ADLT (ELECTROSURGICAL) ×1 IMPLANT
ELECTRODE REM PT RTRN 9FT ADLT (ELECTROSURGICAL) ×1 IMPLANT
ENDOSKELETON IMPLANT SM 7MM (Cage) ×2 IMPLANT
EVACUATOR SILICONE 100CC (DRAIN) IMPLANT
GAUZE 4X4 16PLY ~~LOC~~+RFID DBL (SPONGE) ×1 IMPLANT
GAUZE SPONGE 4X4 12PLY STRL (GAUZE/BANDAGES/DRESSINGS) ×1 IMPLANT
GLOVE BIO SURGEON STRL SZ 6.5 (GLOVE) ×1 IMPLANT
GLOVE BIO SURGEON STRL SZ8 (GLOVE) ×1 IMPLANT
GLOVE BIOGEL PI IND STRL 7.0 (GLOVE) ×2 IMPLANT
GLOVE BIOGEL PI IND STRL 8 (GLOVE) ×1 IMPLANT
GLOVE SURG ENC MOIS LTX SZ6.5 (GLOVE) ×1 IMPLANT
GOWN STRL REUS W/ TWL LRG LVL3 (GOWN DISPOSABLE) ×1 IMPLANT
GOWN STRL REUS W/ TWL XL LVL3 (GOWN DISPOSABLE) ×1 IMPLANT
GRAFT BNE MATRIX VG FRMBL SM 1 (Bone Implant) IMPLANT
IV CATH 14GX2 1/4 (CATHETERS) ×1 IMPLANT
KIT BASIN OR (CUSTOM PROCEDURE TRAY) ×1 IMPLANT
KIT TURNOVER KIT B (KITS) ×1 IMPLANT
MANIFOLD NEPTUNE II (INSTRUMENTS) ×1 IMPLANT
NDL PRECISIONGLIDE 27X1.5 (NEEDLE) ×1 IMPLANT
NDL SPNL 20GX3.5 QUINCKE YW (NEEDLE) ×1 IMPLANT
NEEDLE PRECISIONGLIDE 27X1.5 (NEEDLE) ×1 IMPLANT
NEEDLE SPNL 20GX3.5 QUINCKE YW (NEEDLE) ×1 IMPLANT
NS IRRIG 1000ML POUR BTL (IV SOLUTION) ×1 IMPLANT
PACK ORTHO CERVICAL (CUSTOM PROCEDURE TRAY) ×1 IMPLANT
PAD ARMBOARD 7.5X6 YLW CONV (MISCELLANEOUS) ×2 IMPLANT
PATTIES SURGICAL .5 X.5 (GAUZE/BANDAGES/DRESSINGS) IMPLANT
PATTIES SURGICAL .5 X1 (DISPOSABLE) ×1 IMPLANT
PIN DISTRACTION 14 (PIN) IMPLANT
PLATE SKYLINE 2 LEVEL 34MM (Plate) IMPLANT
POSITIONER HEAD DONUT 9IN (MISCELLANEOUS) ×1 IMPLANT
SCREW SKYLINE VAR OS 14MM (Screw) IMPLANT
SPIKE FLUID TRANSFER (MISCELLANEOUS) ×1 IMPLANT
SPONGE INTESTINAL PEANUT (DISPOSABLE) ×1 IMPLANT
SPONGE SURGIFOAM ABS GEL 100 (HEMOSTASIS) ×1 IMPLANT
STRIP CLOSURE SKIN 1/2X4 (GAUZE/BANDAGES/DRESSINGS) ×1 IMPLANT
SURGIFLO W/THROMBIN 8M KIT (HEMOSTASIS) IMPLANT
SUT MNCRL AB 4-0 PS2 18 (SUTURE) ×1 IMPLANT
SUT SILK 4-0 18XBRD TIE 12 (SUTURE) IMPLANT
SUT VIC AB 2-0 CT2 18 VCP726D (SUTURE) ×1 IMPLANT
SYR BULB IRRIG 60ML STRL (SYRINGE) ×1 IMPLANT
SYR CONTROL 10ML LL (SYRINGE) ×3 IMPLANT
TAPE CLOTH 4X10 WHT NS (GAUZE/BANDAGES/DRESSINGS) ×1 IMPLANT
TAPE UMBILICAL 1/8X30 (MISCELLANEOUS) ×2 IMPLANT
TOWEL GREEN STERILE (TOWEL DISPOSABLE) ×1 IMPLANT
TOWEL GREEN STERILE FF (TOWEL DISPOSABLE) ×1 IMPLANT
WATER STERILE IRR 1000ML POUR (IV SOLUTION) ×1 IMPLANT
YANKAUER SUCT BULB TIP NO VENT (SUCTIONS) ×1 IMPLANT

## 2023-12-28 NOTE — Transfer of Care (Signed)
 Immediate Anesthesia Transfer of Care Note  Patient: Lee Perry  Procedure(s) Performed: ANTERIOR CERVICAL DECOMPRESSION AND FUSION CERVICAL 5- CERVICAL 6, CERVICAL 6- CERVICAL 7 WITH INSTRUMENTATION AND ALLOGRAFT (Neck)  Patient Location: PACU  Anesthesia Type:General  Level of Consciousness: drowsy  Airway & Oxygen Therapy: Patient Spontanous Breathing and Patient connected to face mask oxygen  Post-op Assessment: Report given to RN and Post -op Vital signs reviewed and stable  Post vital signs: Reviewed and stable  Last Vitals:  Vitals Value Taken Time  BP 145/94 12/28/23 1033  Temp    Pulse 95 12/28/23 1037  Resp 12 12/28/23 1037  SpO2 94 % 12/28/23 1037  Vitals shown include unfiled device data.  Last Pain:  Vitals:   12/28/23 0623  TempSrc:   PainSc: 0-No pain         Complications: No notable events documented.

## 2023-12-28 NOTE — Anesthesia Postprocedure Evaluation (Signed)
 Anesthesia Post Note  Patient: Lee Perry  Procedure(s) Performed: ANTERIOR CERVICAL DECOMPRESSION AND FUSION CERVICAL 5- CERVICAL 6, CERVICAL 6- CERVICAL 7 WITH INSTRUMENTATION AND ALLOGRAFT (Neck)     Patient location during evaluation: PACU Anesthesia Type: General Level of consciousness: awake and alert Pain management: pain level controlled Vital Signs Assessment: post-procedure vital signs reviewed and stable Respiratory status: spontaneous breathing, nonlabored ventilation, respiratory function stable and patient connected to nasal cannula oxygen Cardiovascular status: blood pressure returned to baseline and stable Postop Assessment: no apparent nausea or vomiting Anesthetic complications: no   No notable events documented.  Last Vitals:  Vitals:   12/28/23 1115 12/28/23 1130  BP: (!) 148/92 (!) 141/98  Pulse: 92 92  Resp: 16 13  Temp:  37.2 C  SpO2: 94% 92%    Last Pain:  Vitals:   12/28/23 1153  TempSrc:   PainSc: 5                  [place]

## 2023-12-28 NOTE — Op Note (Signed)
 PATIENT NAME: Lee Perry   MEDICAL RECORD NO.:   [id number]    DATE OF BIRTH: November 19, 1975   DATE OF PROCEDURE: 12/28/2023                               OPERATIVE REPORT     PREOPERATIVE DIAGNOSES: 1. Bilateral cervical radiculopathy 2. Spinal stenosis spanning C5-C7   POSTOPERATIVE DIAGNOSES: 1. Bilateral cervical radiculopathy 2. Spinal stenosis spanning C5-C7   PROCEDURE: 1. Anterior cervical decompression and fusion C5/6, C6/7. 2. Placement of anterior instrumentation, C5-C7 (the plate is not integral to the spacers) 3. Insertion of interbody device x 2 (Titan intervertebral spacers). 4. Intraoperative use of fluoroscopy. 5. Use of morselized allograft - ViviGen.   SURGEON:  Estill Bamberg, MD   ASSISTANT:  Jason Coop, PA-C.   ANESTHESIA:  General endotracheal anesthesia.   COMPLICATIONS:  None.   DISPOSITION:  Stable.   ESTIMATED BLOOD LOSS:  Minimal.   INDICATIONS FOR SURGERY:  Briefly, Mr. Lee Perry is a pleasant 48 y.o. year- old patient, who did present to me with pain in the neck and bilateral arms.  The patient's MRI did reveal the findings noted above.  Given the patient's ongoing rather debilitating pain and lack of improvement with appropriate treatment measures, we did discuss proceeding with the procedure noted above.  The patient was fully aware of the risks and limitations of surgery as outlined in my preoperative note.   OPERATIVE DETAILS:  On 12/28/2023, the patient was brought to surgery and general endotracheal anesthesia was administered.  The patient was placed supine on the hospital bed. The neck was gently extended.  All bony prominences were meticulously padded.  The neck was prepped and draped in the usual sterile fashion.  At this point, I did make a left-sided transverse incision.  The platysma was incised.  A Smith-Robinson approach was used and the anterior spine was identified. A self-retaining retractor was placed.  I then  subperiosteally exposed the vertebral bodies from C5-C7.  Caspar pins were then placed into the C6 and C7 vertebral bodies and distraction was applied.  A thorough and complete C6-7 intervertebral diskectomy was performed.  The posterior longitudinal ligament was identified and entered using a nerve hook.  I then used #1 followed by #2 Kerrison to perform a thorough and complete intervertebral diskectomy.  The spinal canal was thoroughly decompressed, as was the right and left neuroforamen.  The endplates were then prepared and the appropriate-sized intervertebral spacer was then packed with ViviGen and tamped into position in the usual fashion.  The lower Caspar pin was then removed and placed into the C5 vertebral body and once again, distraction was applied across the C5-6 intervertebral space.  I then again performed a thorough and complete diskectomy, thoroughly decompressing the spinal canal and bilateral neuroforamena.  After preparing the endplates, the appropriate-sized intervertebral spacer was packed with ViviGen and tamped into position.  The Caspar pins then were removed and bone wax was placed in their place.  The appropriate-sized anterior cervical plate was placed over the anterior spine.  14 mm variable angle screws were placed, 2 in each vertebral body from C5-C7 for a total of 6 vertebral body screws.  The screws were then locked to the plate using the Cam locking mechanism.  I was very pleased with the final fluoroscopic images.  The wound was then irrigated.  The wound was then explored for any undue bleeding and there  was no bleeding noted. The wound was then closed in layers using 2-0 Vicryl, followed by 4-0 Monocryl.  Benzoin and Steri-Strips were applied, followed by sterile dressing.  All instrument counts were correct at the termination of the procedure.   Of note, Jason Coop, PA-C, was my assistant throughout surgery, and did aid in retraction, suctioning,  placement of the hardware, and closure from start to finish.       Estill Bamberg, MD

## 2023-12-28 NOTE — H&P (Signed)
     PREOPERATIVE H&P  Chief Complaint: Bilateral arm pain  HPI: Lee Perry is a 48 y.o. male who presents with ongoing pain in the bilateral arms  MRI reveals spinal stenosis C5-C7  Patient has failed multiple forms of conservative care and continues to have pain (see office notes for additional details regarding the patient's full course of treatment)  Past Medical History:  Diagnosis Date   Anxiety    GERD (gastroesophageal reflux disease)    History of hiatal hernia    History of kidney stones    Hyperlipemia    Pneumonia    Past Surgical History:  Procedure Laterality Date   POLYPECTOMY     Vocal Chord Polyp Removed 2000s   SKIN CANCER EXCISION     Top of head and behind left ear and left shoulder   Social History   Socioeconomic History   Marital status: Married    Spouse name: Not on file   Number of children: Not on file   Years of education: Not on file   Highest education level: Not on file  Occupational History   Not on file  Tobacco Use   Smoking status: Former    Types: Cigarettes    Start date: 2022   Smokeless tobacco: Never  Vaping Use   Vaping status: Every Day  Substance and Sexual Activity   Alcohol use: No   Drug use: No   Sexual activity: Yes  Other Topics Concern   Not on file  Social History Narrative   Caffiene sundrop 3-4 day   Welding occupation.     Social Drivers of Corporate investment banker Strain: Not on file  Food Insecurity: Not on file  Transportation Needs: Not on file  Physical Activity: Not on file  Stress: Not on file  Social Connections: Not on file   Family History  Problem Relation Age of Onset   Diabetes Mother    Diabetes Father    Heart attack Father    Allergies  Allergen Reactions   Sulfa Antibiotics Hives    Childhood reaction (fever)   Prior to Admission medications   Medication Sig Start Date End Date Taking? Authorizing Provider  HYDROcodone-acetaminophen (NORCO) 10-325 MG tablet  Take 1 tablet by mouth every 6 (six) hours as needed for pain.   Yes [provider]  zolpidem (AMBIEN) 10 MG tablet Take 5-10 mg by mouth at bedtime.   Yes [provider]     All other systems have been reviewed and were otherwise negative with the exception of those mentioned in the HPI and as above.  Physical Exam: Vitals:   12/28/23 0605  BP: 131/88  Pulse: 82  Resp: 18  Temp: 98.6 F (37 C)  SpO2: 97%    Body mass index is 29.76 kg/m.  General: Alert, no acute distress Cardiovascular: No pedal edema Respiratory: No cyanosis, no use of accessory musculature Skin: No lesions in the area of chief complaint Neurologic: Sensation intact distally Psychiatric: Patient is competent for consent with normal mood and affect Lymphatic: No axillary or cervical lymphadenopathy   Assessment/Plan: CERVICAL RADICULOPATHY Plan for Procedure(s): ANTERIOR CERVICAL DECOMPRESSION AND FUSION CERVICAL 5- CERVICAL 6, CERVICAL 6- CERVICAL 7 WITH INSTRUMENTATION AND ALLOGRAFT   Jackelyn Hoehn, MD 12/28/2023 7:20 AM

## 2023-12-28 NOTE — Anesthesia Procedure Notes (Signed)
 Procedure Name: Intubation Date/Time: 12/28/2023 7:44 AM  Performed by: Thomasene Ripple, CRNAPre-anesthesia Checklist: Patient identified, Emergency Drugs available, Suction available and Patient being monitored Patient Re-evaluated:Patient Re-evaluated prior to induction Oxygen Delivery Method: Circle System Utilized Preoxygenation: Pre-oxygenation with 100% oxygen Induction Type: IV induction Ventilation: Mask ventilation without difficulty Laryngoscope Size: Glidescope and 4 Grade View: Grade I Tube type: Oral Tube size: 8.0 mm Number of attempts: 1 Airway Equipment and Method: Stylet and Oral airway Placement Confirmation: ETT inserted through vocal cords under direct vision, positive ETCO2 and breath sounds checked- equal and bilateral Secured at: 24 cm Tube secured with: Tape Dental Injury: Teeth and Oropharynx as per pre-operative assessment

## 2023-12-28 NOTE — Anesthesia Preprocedure Evaluation (Signed)
 Anesthesia Evaluation  Patient identified by MRN, date of birth, ID band Patient awake    Reviewed: Allergy & Precautions, H&P , NPO status , Patient's Chart, lab work & pertinent test results  Airway Mallampati: II  TM Distance: >3 FB Neck ROM: Full    Dental no notable dental hx.    Pulmonary former smoker   Pulmonary exam normal breath sounds clear to auscultation       Cardiovascular negative cardio ROS Normal cardiovascular exam Rhythm:Regular Rate:Normal     Neuro/Psych  PSYCHIATRIC DISORDERS Anxiety     Cervical radiculopathy    GI/Hepatic Neg liver ROS, hiatal hernia,GERD  ,,  Endo/Other  negative endocrine ROS    Renal/GU negative Renal ROS  negative genitourinary   Musculoskeletal negative musculoskeletal ROS (+)    Abdominal   Peds negative pediatric ROS (+)  Hematology negative hematology ROS (+)   Anesthesia Other Findings   Reproductive/Obstetrics negative OB ROS                             Anesthesia Physical Anesthesia Plan  ASA: 2  Anesthesia Plan: General   Post-op Pain Management: Tylenol PO (pre-op)*   Induction: Intravenous  PONV Risk Score and Plan: 3 and Ondansetron, Dexamethasone and Midazolam  Airway Management Planned: Oral ETT and Video Laryngoscope Planned  Additional Equipment:   Intra-op Plan:   Post-operative Plan: Extubation in OR  Informed Consent: I have reviewed the patients History and Physical, chart, labs and discussed the procedure including the risks, benefits and alternatives for the proposed anesthesia with the patient or authorized representative who has indicated his/her understanding and acceptance.     Dental advisory given  Plan Discussed with: CRNA  Anesthesia Plan Comments:        Anesthesia Quick Evaluation

## 2023-12-29 ENCOUNTER — Encounter (HOSPITAL_COMMUNITY): Payer: Self-pay | Admitting: Orthopedic Surgery

## 2024-03-01 IMAGING — DX DG CHEST 2V
2 series · 2 of 2 positions shown · non-contrast
Comparison: 04/13/2017

CLINICAL DATA: Chest pain.

EXAM:
CHEST - 2 VIEW

[chest pa]
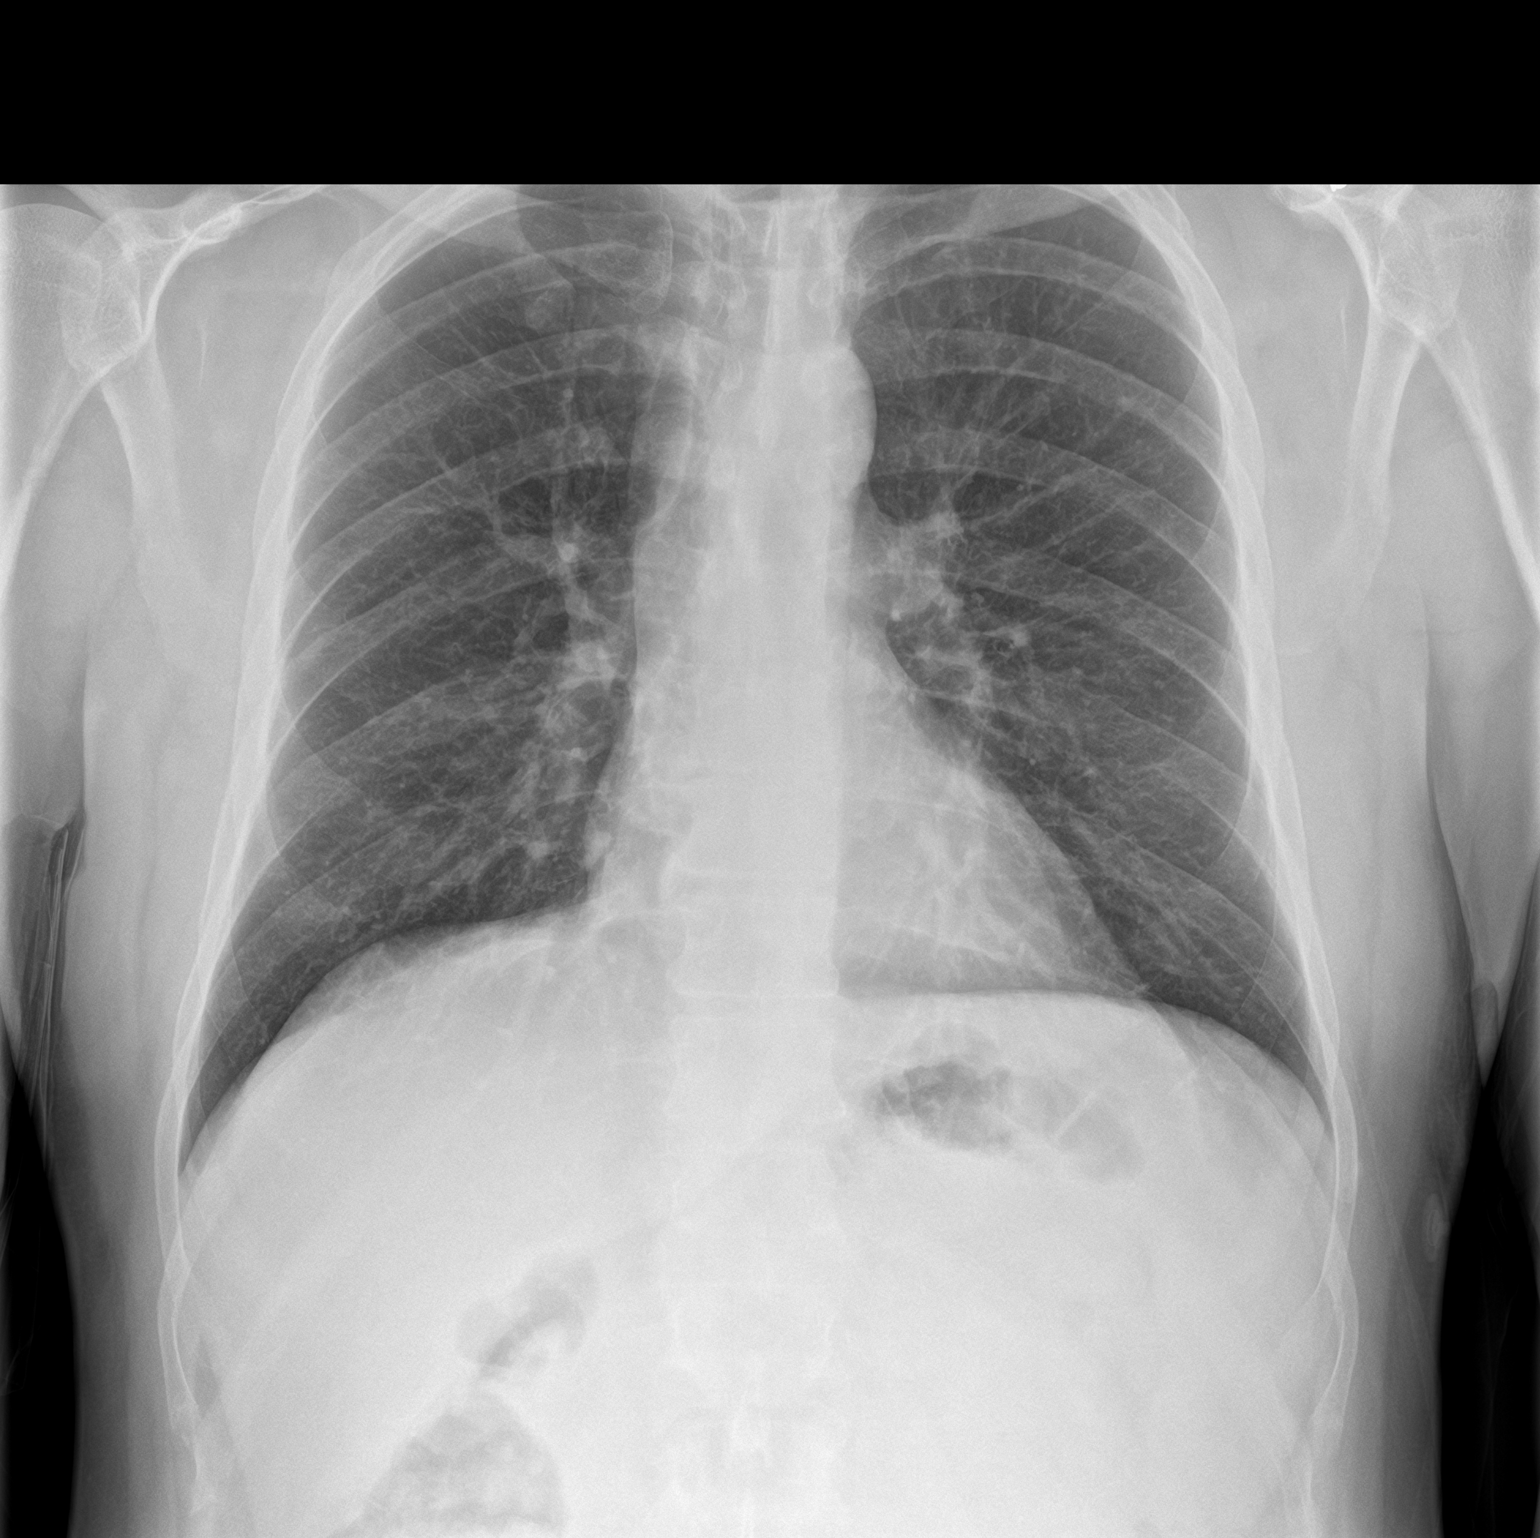

[chest lat]
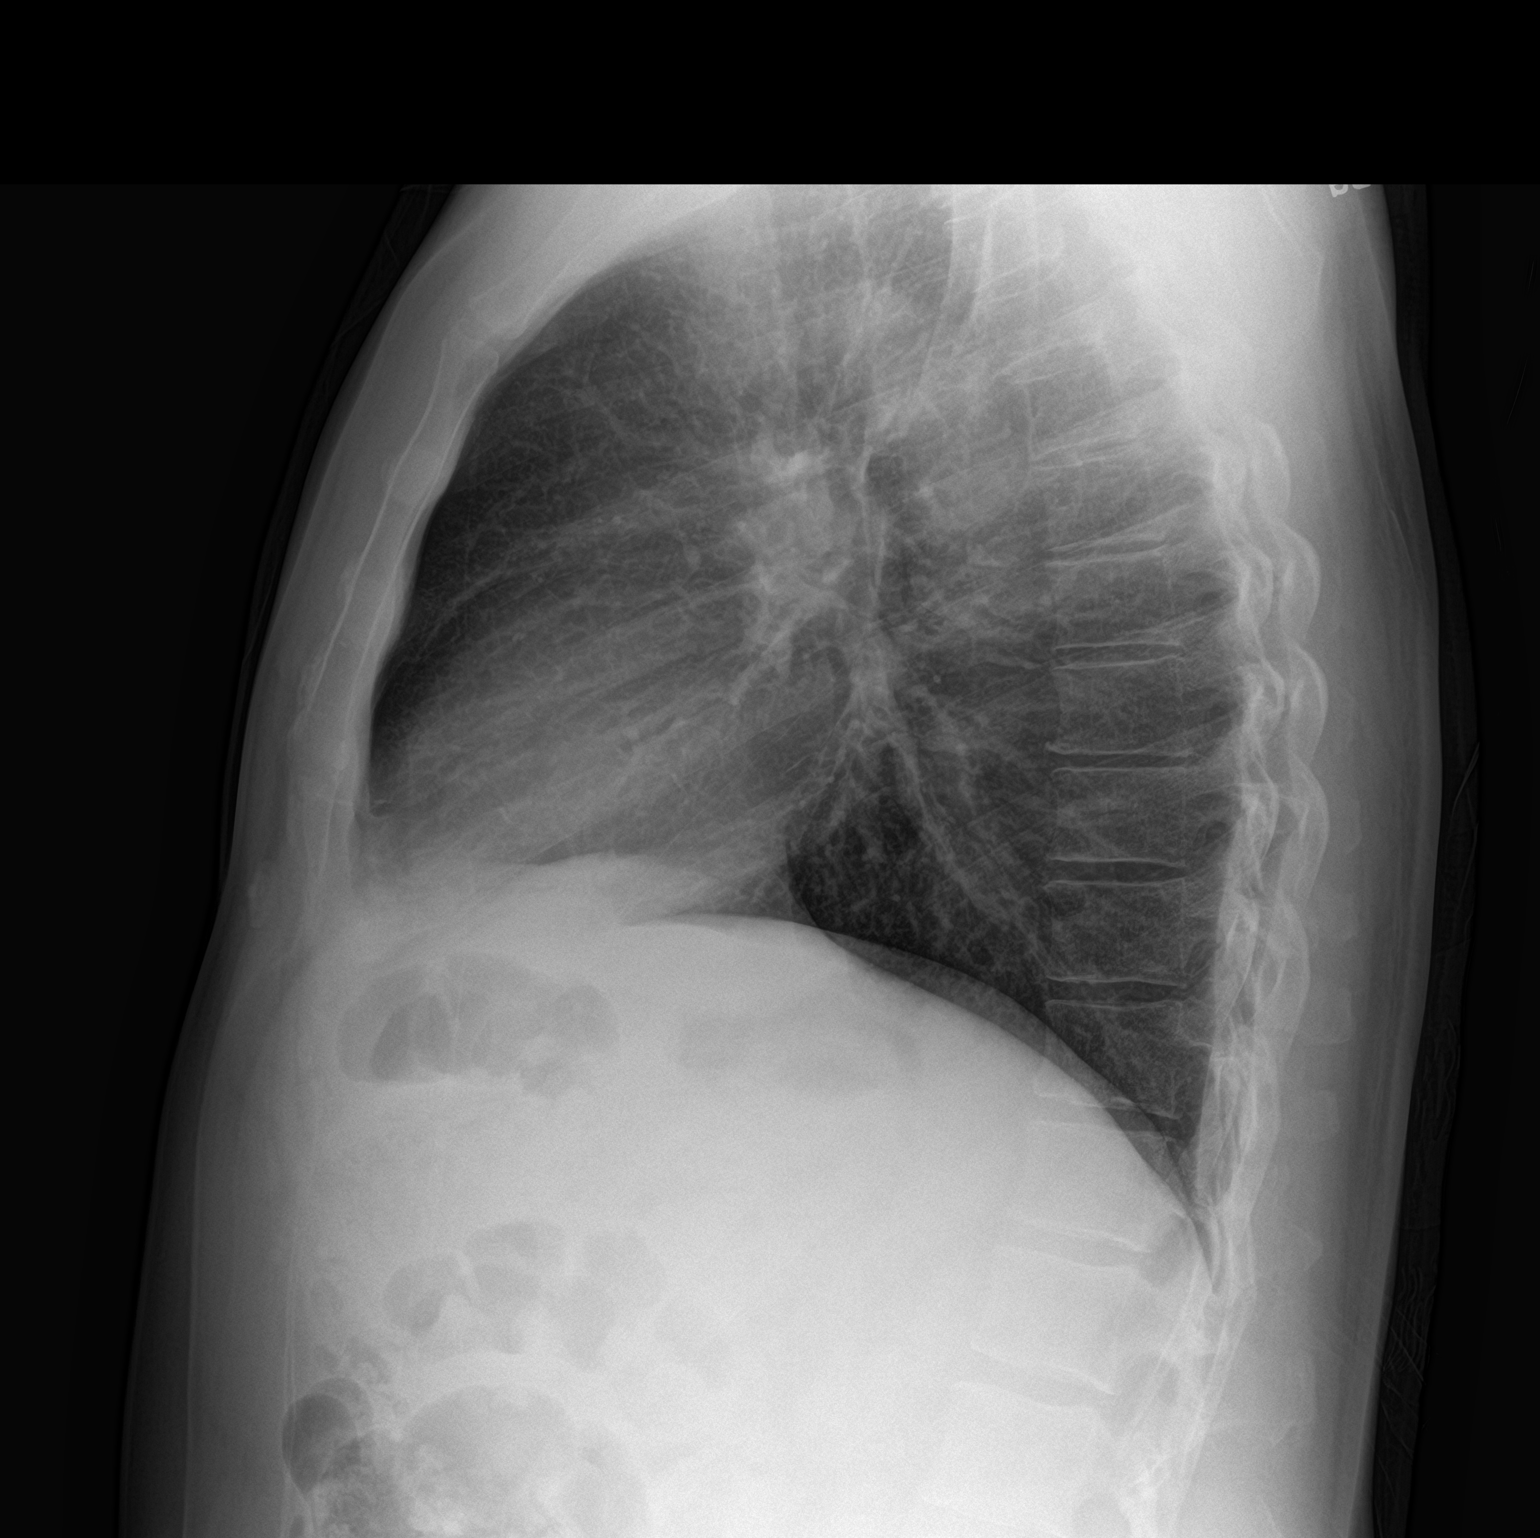

[2 of 2 positions shown; findings below may reference images not displayed]

FINDINGS: The heart size and mediastinal contours are within normal limits.
Both lungs are clear. The visualized skeletal structures are
unremarkable.
IMPRESSION: No active cardiopulmonary disease.
